# Patient Record
Sex: Female | Born: 1935 | Race: Black or African American | Hispanic: No | Marital: Married | Smoking: Never smoker
Health system: Southern US, Community
[De-identification: ages and names within clinical notes are randomized; demographics above are authoritative.]

## PROBLEM LIST (undated history)

## (undated) DIAGNOSIS — I10 Essential (primary) hypertension: Secondary | ICD-10-CM

## (undated) DIAGNOSIS — R011 Cardiac murmur, unspecified: Secondary | ICD-10-CM

## (undated) DIAGNOSIS — J45909 Unspecified asthma, uncomplicated: Secondary | ICD-10-CM

## (undated) DIAGNOSIS — G4730 Sleep apnea, unspecified: Secondary | ICD-10-CM

## (undated) DIAGNOSIS — Z8719 Personal history of other diseases of the digestive system: Secondary | ICD-10-CM

## (undated) DIAGNOSIS — M1990 Unspecified osteoarthritis, unspecified site: Secondary | ICD-10-CM

## (undated) DIAGNOSIS — E119 Type 2 diabetes mellitus without complications: Secondary | ICD-10-CM

## (undated) DIAGNOSIS — I739 Peripheral vascular disease, unspecified: Secondary | ICD-10-CM

## (undated) DIAGNOSIS — J189 Pneumonia, unspecified organism: Secondary | ICD-10-CM

## (undated) HISTORY — PX: ELBOW SURGERY: SHX618

---
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
## 1999-01-16 ENCOUNTER — Other Ambulatory Visit: Admission: RE | Admit: 1999-01-16 | Discharge: 1999-01-16 | Payer: Self-pay | Admitting: *Deleted

## 1999-11-04 ENCOUNTER — Emergency Department (HOSPITAL_COMMUNITY): Admission: EM | Admit: 1999-11-04 | Discharge: 1999-11-04 | Payer: Self-pay | Admitting: Emergency Medicine

## 1999-11-26 ENCOUNTER — Ambulatory Visit (HOSPITAL_COMMUNITY): Admission: RE | Admit: 1999-11-26 | Discharge: 1999-11-26 | Payer: Self-pay | Admitting: Cardiovascular Disease

## 1999-11-26 ENCOUNTER — Encounter: Payer: Self-pay | Admitting: Cardiovascular Disease

## 1999-12-27 ENCOUNTER — Other Ambulatory Visit: Admission: RE | Admit: 1999-12-27 | Discharge: 1999-12-27 | Payer: Self-pay | Admitting: *Deleted

## 2001-01-12 ENCOUNTER — Other Ambulatory Visit: Admission: RE | Admit: 2001-01-12 | Discharge: 2001-01-12 | Payer: Self-pay | Admitting: *Deleted

## 2001-09-22 ENCOUNTER — Emergency Department (HOSPITAL_COMMUNITY): Admission: EM | Admit: 2001-09-22 | Discharge: 2001-09-22 | Payer: Self-pay

## 2002-04-18 ENCOUNTER — Encounter: Admission: RE | Admit: 2002-04-18 | Discharge: 2002-07-17 | Payer: Self-pay | Admitting: Internal Medicine

## 2002-11-17 ENCOUNTER — Ambulatory Visit (HOSPITAL_BASED_OUTPATIENT_CLINIC_OR_DEPARTMENT_OTHER): Admission: RE | Admit: 2002-11-17 | Discharge: 2002-11-17 | Payer: Self-pay | Admitting: Internal Medicine

## 2003-01-04 ENCOUNTER — Ambulatory Visit (HOSPITAL_BASED_OUTPATIENT_CLINIC_OR_DEPARTMENT_OTHER): Admission: RE | Admit: 2003-01-04 | Discharge: 2003-01-04 | Payer: Self-pay | Admitting: Internal Medicine

## 2004-01-17 ENCOUNTER — Other Ambulatory Visit: Admission: RE | Admit: 2004-01-17 | Discharge: 2004-01-17 | Payer: Self-pay | Admitting: Obstetrics and Gynecology

## 2004-02-08 ENCOUNTER — Emergency Department (HOSPITAL_COMMUNITY): Admission: EM | Admit: 2004-02-08 | Discharge: 2004-02-08 | Payer: Self-pay | Admitting: Family Medicine

## 2006-02-02 ENCOUNTER — Ambulatory Visit: Payer: Self-pay | Admitting: Gastroenterology

## 2006-02-03 ENCOUNTER — Ambulatory Visit: Payer: Self-pay | Admitting: Gastroenterology

## 2006-03-12 ENCOUNTER — Ambulatory Visit: Payer: Self-pay | Admitting: Gastroenterology

## 2011-03-04 ENCOUNTER — Other Ambulatory Visit: Payer: Self-pay | Admitting: Gastroenterology

## 2011-03-25 ENCOUNTER — Inpatient Hospital Stay (INDEPENDENT_AMBULATORY_CARE_PROVIDER_SITE_OTHER)
Admission: RE | Admit: 2011-03-25 | Discharge: 2011-03-25 | Disposition: A | Discharge status: Home / Self Care | Payer: Medicare Other | Source: Ambulatory Visit | Attending: Family Medicine | Admitting: Family Medicine

## 2011-03-25 DIAGNOSIS — T6391XA Toxic effect of contact with unspecified venomous animal, accidental (unintentional), initial encounter: Secondary | ICD-10-CM

## 2011-04-23 ENCOUNTER — Encounter: Payer: Self-pay | Admitting: Internal Medicine

## 2012-06-23 ENCOUNTER — Encounter: Payer: Self-pay | Admitting: Internal Medicine

## 2016-07-24 ENCOUNTER — Emergency Department (HOSPITAL_COMMUNITY): Payer: Medicare Other

## 2016-07-24 ENCOUNTER — Emergency Department (HOSPITAL_COMMUNITY)
Admission: EM | Admit: 2016-07-24 | Discharge: 2016-07-24 | Disposition: A | Discharge status: Home / Self Care | Payer: Medicare Other | Attending: Emergency Medicine | Admitting: Emergency Medicine

## 2016-07-24 ENCOUNTER — Encounter (HOSPITAL_COMMUNITY): Payer: Self-pay

## 2016-07-24 DIAGNOSIS — Y9389 Activity, other specified: Secondary | ICD-10-CM | POA: Insufficient documentation

## 2016-07-24 DIAGNOSIS — R9089 Other abnormal findings on diagnostic imaging of central nervous system: Secondary | ICD-10-CM | POA: Diagnosis not present

## 2016-07-24 DIAGNOSIS — W108XXA Fall (on) (from) other stairs and steps, initial encounter: Secondary | ICD-10-CM | POA: Diagnosis not present

## 2016-07-24 DIAGNOSIS — M546 Pain in thoracic spine: Secondary | ICD-10-CM | POA: Insufficient documentation

## 2016-07-24 DIAGNOSIS — E119 Type 2 diabetes mellitus without complications: Secondary | ICD-10-CM | POA: Diagnosis not present

## 2016-07-24 DIAGNOSIS — W19XXXA Unspecified fall, initial encounter: Secondary | ICD-10-CM

## 2016-07-24 DIAGNOSIS — Y9289 Other specified places as the place of occurrence of the external cause: Secondary | ICD-10-CM | POA: Diagnosis not present

## 2016-07-24 DIAGNOSIS — Z5181 Encounter for therapeutic drug level monitoring: Secondary | ICD-10-CM | POA: Diagnosis not present

## 2016-07-24 DIAGNOSIS — Y999 Unspecified external cause status: Secondary | ICD-10-CM | POA: Insufficient documentation

## 2016-07-24 DIAGNOSIS — R51 Headache: Secondary | ICD-10-CM | POA: Insufficient documentation

## 2016-07-24 DIAGNOSIS — M549 Dorsalgia, unspecified: Secondary | ICD-10-CM

## 2016-07-24 HISTORY — DX: Type 2 diabetes mellitus without complications: E11.9

## 2016-07-24 LAB — PROTIME-INR
INR: 0.96
PROTHROMBIN TIME: 12.7 s (ref 11.4–15.2)

## 2016-07-24 LAB — CBC
HEMATOCRIT: 36 % (ref 36.0–46.0)
HEMOGLOBIN: 12.1 g/dL (ref 12.0–15.0)
MCH: 30.3 pg (ref 26.0–34.0)
MCHC: 33.6 g/dL (ref 30.0–36.0)
MCV: 90.2 fL (ref 78.0–100.0)
Platelets: 265 10*3/uL (ref 150–400)
RBC: 3.99 MIL/uL (ref 3.87–5.11)
RDW: 14 % (ref 11.5–15.5)
WBC: 6.1 10*3/uL (ref 4.0–10.5)

## 2016-07-24 LAB — I-STAT CHEM 8, ED
BUN: 33 mg/dL — ABNORMAL HIGH (ref 6–20)
CALCIUM ION: 1.23 mmol/L (ref 1.15–1.40)
CREATININE: 1.4 mg/dL — AB (ref 0.44–1.00)
Chloride: 109 mmol/L (ref 101–111)
Glucose, Bld: 116 mg/dL — ABNORMAL HIGH (ref 65–99)
HEMATOCRIT: 36 % (ref 36.0–46.0)
HEMOGLOBIN: 12.2 g/dL (ref 12.0–15.0)
Potassium: 5.2 mmol/L — ABNORMAL HIGH (ref 3.5–5.1)
SODIUM: 139 mmol/L (ref 135–145)
TCO2: 23 mmol/L (ref 0–100)

## 2016-07-24 LAB — URINALYSIS, ROUTINE W REFLEX MICROSCOPIC
Bilirubin Urine: NEGATIVE
GLUCOSE, UA: NEGATIVE mg/dL
Hgb urine dipstick: NEGATIVE
Ketones, ur: NEGATIVE mg/dL
LEUKOCYTES UA: NEGATIVE
NITRITE: NEGATIVE
PH: 5.5 (ref 5.0–8.0)
Protein, ur: NEGATIVE mg/dL
SPECIFIC GRAVITY, URINE: 1.008 (ref 1.005–1.030)

## 2016-07-24 LAB — COMPREHENSIVE METABOLIC PANEL
ALT: 21 U/L (ref 14–54)
ANION GAP: 7 (ref 5–15)
AST: 26 U/L (ref 15–41)
Albumin: 4.2 g/dL (ref 3.5–5.0)
Alkaline Phosphatase: 67 U/L (ref 38–126)
BUN: 35 mg/dL — AB (ref 6–20)
CHLORIDE: 109 mmol/L (ref 101–111)
CO2: 23 mmol/L (ref 22–32)
Calcium: 9.8 mg/dL (ref 8.9–10.3)
Creatinine, Ser: 1.4 mg/dL — ABNORMAL HIGH (ref 0.44–1.00)
GFR calc Af Amer: 39 mL/min — ABNORMAL LOW (ref >60)
GFR, EST NON AFRICAN AMERICAN: 34 mL/min — AB (ref >60)
Glucose, Bld: 122 mg/dL — ABNORMAL HIGH (ref 65–99)
POTASSIUM: 5.1 mmol/L (ref 3.5–5.1)
Sodium: 139 mmol/L (ref 135–145)
Total Bilirubin: 0.4 mg/dL (ref 0.3–1.2)
Total Protein: 7.1 g/dL (ref 6.5–8.1)

## 2016-07-24 LAB — SAMPLE TO BLOOD BANK

## 2016-07-24 LAB — CBG MONITORING, ED: Glucose-Capillary: 110 mg/dL — ABNORMAL HIGH (ref 65–99)

## 2016-07-24 LAB — I-STAT CG4 LACTIC ACID, ED: Lactic Acid, Venous: 0.56 mmol/L (ref 0.5–1.9)

## 2016-07-24 MED ORDER — TRAMADOL HCL 50 MG PO TABS: 50.0000 mg | ORAL_TABLET | Freq: Four times a day (QID) | ORAL | 0 refills | Status: DC | PRN

## 2016-07-24 MED ORDER — SODIUM CHLORIDE 0.9 % IV BOLUS (SEPSIS)
500.0000 mL | Freq: Once | INTRAVENOUS | Status: AC
  Administered 2016-07-24: 500 mL via INTRAVENOUS

## 2016-07-24 MED ORDER — FENTANYL CITRATE (PF) 100 MCG/2ML IJ SOLN
50.0000 ug | Freq: Once | INTRAMUSCULAR | Status: AC
  Administered 2016-07-24: 50 ug via INTRAVENOUS
  Filled 2016-07-24: qty 2

## 2016-07-24 MED ORDER — ONDANSETRON HCL 4 MG/2ML IJ SOLN
4.0000 mg | Freq: Once | INTRAMUSCULAR | Status: AC
  Administered 2016-07-24: 4 mg via INTRAVENOUS
  Filled 2016-07-24: qty 2

## 2016-07-24 NOTE — ED Triage Notes (Signed)
[place] escal[place]. The hand railing stopped but the stairs kept moving. The patient reports falling on her buttocks and then falling multiple times as she preceded down the escalator. Endorses hitting her head but denies losing consciousness. Endorses nausea. EMS said vitals during transport were 170/80 with HR 80. Pt has hx of diabetes.

## 2016-07-24 NOTE — ED Provider Notes (Signed)
[facility] DEPT Provider Note   CSN: [id number] Arrival date & time: 07/24/16  1139     History   Chief Complaint Chief Complaint  Patient presents with  . Fall    HPI Janet Gilbert is a 82 y.o. female with a past medical history significant for diabetes who presents with back pain and headache from a fall. Patient reports that she was at the mall going down escalator today when she fell. She reports that the handrail stopped moving and the stairs continued to descend causing her to fall onto her back. She reports that she then tumbled down the escalator causing injury to her back. She says that she did not lose consciousness but is having moderate to severe pain in the central upper back and her head. She reports pain in the occipital area. Of note, she reports that she was wearing a very "large puffy jacket" that may have helped break the fall. She denies any chest pain, shortness of breath, abdominal pain. She denies any numbness, tingling, or weakness of extremity. She denies any vision changes, nausea, vomiting, or any other complaints.  He has not taken any medications to help her symptoms. EMS was called from the mall and brought her for evaluation. She has not lost control of her bowel or bladder.  She denied any preceding symptoms before her fall.  The history is provided by the patient, the spouse and a relative. No language interpreter was used.  Fall  This is a new problem. The current episode started less than 1 hour ago. The problem has not changed since onset.Associated symptoms include headaches. Pertinent negatives include no chest pain, no abdominal pain and no shortness of breath. The symptoms are aggravated by bending. Nothing relieves the symptoms. She has tried nothing for the symptoms. The treatment provided no relief.    Past Medical History:  Diagnosis Date  . Diabetes mellitus without complication ([place])     There are no active problems to display for this  patient.   History reviewed. No pertinent surgical history.  OB History    No data available       Home Medications    Prior to Admission medications   Not on File    Family History No family history on file.  Social History Social History  Substance Use Topics  . Smoking status: Not on file  . Smokeless tobacco: Not on file  . Alcohol use Not on file     Allergies   Contrast media [iodinated diagnostic agents] and Sulfa antibiotics   Review of Systems Review of Systems  Constitutional: Negative for activity change, chills, diaphoresis, fatigue and fever.  HENT: Negative for congestion and rhinorrhea.   Eyes: Negative for visual disturbance.  Respiratory: Negative for cough, chest tightness, shortness of breath and stridor.   Cardiovascular: Negative for chest pain, palpitations and leg swelling.  Gastrointestinal: Negative for abdominal distention, abdominal pain, constipation, diarrhea, nausea and vomiting.  Genitourinary: Negative for difficulty urinating, dysuria, flank pain, frequency, hematuria, menstrual problem, pelvic pain, vaginal bleeding and vaginal discharge.  Musculoskeletal: Positive for back pain. Negative for neck pain and neck stiffness.  Skin: Negative for rash and wound.  Neurological: Positive for headaches. Negative for dizziness, weakness, light-headedness and numbness.  Psychiatric/Behavioral: Negative for agitation and confusion.  All other systems reviewed and are negative.    Physical Exam Updated Vital Signs BP 143/77 (BP Location: Right Arm)   Pulse 82   Temp 97.9 F (36.6 C) (Oral)  Resp 18   SpO2 100%   Physical Exam  Constitutional: She is oriented to person, place, and time. She appears well-developed and well-nourished. No distress.  HENT:  Head: Normocephalic and atraumatic. Head is without abrasion, without contusion and without laceration.    Mouth/Throat: Oropharynx is clear and moist. No oropharyngeal exudate.   Eyes: Conjunctivae and EOM are normal. Pupils are equal, round, and reactive to light.  Neck: Normal range of motion. Neck supple.  Cardiovascular: Normal rate and regular rhythm.   No murmur heard. Pulmonary/Chest: Effort normal and breath sounds normal. No stridor. No respiratory distress. She has no wheezes. She exhibits no tenderness.  Abdominal: Soft. There is no tenderness.  Musculoskeletal: She exhibits tenderness. She exhibits no edema.       Thoracic back: She exhibits tenderness.       Back:  Neurological: She is alert and oriented to person, place, and time. She has normal reflexes. She displays no tremor and normal reflexes. No cranial nerve deficit or sensory deficit. She exhibits normal muscle tone. She displays no seizure activity. Coordination normal.  Skin: Skin is warm and dry. Capillary refill takes less than 2 seconds. No rash noted.  Psychiatric: She has a normal mood and affect.  Nursing note and vitals reviewed.    ED Treatments / Results  Labs (all labs ordered are listed, but only abnormal results are displayed) Labs Reviewed  COMPREHENSIVE METABOLIC PANEL - Abnormal; Notable for the following:       Result Value   Glucose, Bld 122 (*)    BUN 35 (*)    Creatinine, Ser 1.40 (*)    GFR calc non Af Amer 34 (*)    GFR calc Af Amer 39 (*)    All other components within normal limits  CBG MONITORING, ED - Abnormal; Notable for the following:    Glucose-Capillary 110 (*)    All other components within normal limits  I-STAT CHEM 8, ED - Abnormal; Notable for the following:    Potassium 5.2 (*)    BUN 33 (*)    Creatinine, Ser 1.40 (*)    Glucose, Bld 116 (*)    All other components within normal limits  CBC  URINALYSIS, ROUTINE W REFLEX MICROSCOPIC (NOT AT [facility])  PROTIME-INR  I-STAT CG4 LACTIC ACID, ED  SAMPLE TO BLOOD BANK    EKG  EKG Interpretation None       Radiology Ct Abdomen Pelvis Wo Contrast  Result Date: 07/24/2016 CLINICAL DATA:   [place] [address] down escalator today. EXAM: CT CHEST, ABDOMEN AND PELVIS WITHOUT CONTRAST TECHNIQUE: Multidetector CT imaging of the chest, abdomen and pelvis was performed following the standard protocol without IV contrast. COMPARISON:  None. FINDINGS: CT CHEST FINDINGS Cardiovascular: The heart is normal in size. No pericardial effusion. The aorta is normal in caliber. Minimal scattered atherosclerotic calcifications. Coronary artery calcifications are noted. Mediastinum/Nodes: There is a rounded soft tissue mass in the anterior mediastinum measuring 20 x 19 mm. Is most likely a benign thymic lesion but will require followup. A repeat noncontrast chest CT in 4-6 months is suggested. Small scattered mediastinal and hilar lymph nodes but no overt adenopathy. The esophagus is grossly normal. Lungs/Pleura: No acute pulmonary findings. No pulmonary contusion, infiltrate or pleural effusion. No pneumothorax. A few tiny scattered sub 4 mm nodules are likely benign. Musculoskeletal: The bony thorax is intact. No obvious rib, sternal or thoracic vertebral body fractures. Scattered hemangiomas are noted. CT ABDOMEN PELVIS FINDINGS Hepatobiliary: No focal hepatic lesions are  identified without contrast. No findings to suggest an acute injury. No perihepatic fluid collections. The gallbladder demonstrates numerous layering gallstones. The common bile duct is normal in caliber. Pancreas: No mass, inflammation or ductal dilatation. Spleen: Normal size.  No focal lesions. Adrenals/Urinary Tract: Mild enlargement and nodularity of the adrenal gland suggesting nodular hyperplasia. 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 A 3 cm cyst projects off the midpole region of the left kidney. A small cyst projects off the midpole region of the right kidney also. No renal or obstructing ureteral calculi. No bladder calculi. Stomach/Bowel: The stomach, duodenum, small bowel and colon are unremarkable. No  inflammatory changes, mass lesions or obstructive findings. Moderate sigmoid diverticulosis without findings for acute diverticulitis. The terminal ileum is normal. Vascular/Lymphatic: Fell mesenteric or retroperitoneal mass, adenopathy or hematoma. Small scattered lymph nodes are noted. The aorta is normal in caliber. Moderate scattered atherosclerotic calcifications. Reproductive: The uterus is surgically absent. The right ovary is still present. Other: No old abnormal wall abdominal/pelvic fluid collections or hematoma. Musculoskeletal: No significant bony findings. The bony pelvis is intact. IMPRESSION: 1. No acute findings involving the chest, abdomen or pelvis. 2. **An incidental finding of potential clinical significance has been found. There is a rounded soft tissue mass in the anterior mediastinum measuring 20 x 19 mm. Is most likely a benign thymic lesion but will require followup. A repeat noncontrast chest CT in 4-6 months is suggested.** Electronically Signed   By: Marijo Sanes M.D.   On: 07/24/2016 14:07   Ct Head Wo Contrast  Result Date: 07/24/2016 CLINICAL DATA:  [place] [address] down escalator , hit back of the head, neck pain EXAM: CT HEAD WITHOUT CONTRAST CT CERVICAL SPINE WITHOUT CONTRAST TECHNIQUE: Multidetector CT imaging of the head and cervical spine was performed following the standard protocol without intravenous contrast. Multiplanar CT image reconstructions of the cervical spine were also generated. COMPARISON:  None. FINDINGS: CT HEAD FINDINGS Brain: No intracranial hemorrhage, mass effect or midline shift. No acute cortical infarction. Mild cerebral atrophy. Mild periventricular chronic white matter disease. No mass lesion is noted on this unenhanced scan. Vascular: Atherosclerotic calcifications of carotid siphon. Skull: No skull fracture is noted. Sinuses/Orbits: Paranasal sinuses and mastoid air cells are unremarkable Other: None CT CERVICAL SPINE FINDINGS Alignment: Alignment is  preserved. Skull base and vertebrae: No acute fracture or subluxation. Degenerative changes are noted C1-C2 articulation. There is anterior spurring lower endplate of C5 and C6 vertebral body. Soft tissues and spinal canal: No prevertebral soft tissue swelling. Cervical airway is patent. Disc levels:  Disc spaces are preserved Upper chest: The visualized lung apices shows no evidence of pneumothorax. Other: None IMPRESSION: 1. No acute intracranial abnormality.  Mild cerebral atrophy. 2. No cervical spine acute fracture or subluxation. Mild degenerative changes. Electronically Signed   By: Lahoma Crocker M.D.   On: 07/24/2016 13:55   Ct Chest Wo Contrast  Result Date: 07/24/2016 CLINICAL DATA:  [place] [address] down escalator today. EXAM: CT CHEST, ABDOMEN AND PELVIS WITHOUT CONTRAST TECHNIQUE: Multidetector CT imaging of the chest, abdomen and pelvis was performed following the standard protocol without IV contrast. COMPARISON:  None. FINDINGS: CT CHEST FINDINGS Cardiovascular: The heart is normal in size. No pericardial effusion. The aorta is normal in caliber. Minimal scattered atherosclerotic calcifications. Coronary artery calcifications are noted. Mediastinum/Nodes: There is a rounded soft tissue mass in the anterior mediastinum measuring 20 x 19 mm. Is most likely a benign thymic lesion but will require followup.  A repeat noncontrast chest CT in 4-6 months is suggested. Small scattered mediastinal and hilar lymph nodes but no overt adenopathy. The esophagus is grossly normal. Lungs/Pleura: No acute pulmonary findings. No pulmonary contusion, infiltrate or pleural effusion. No pneumothorax. A few tiny scattered sub 4 mm nodules are likely benign. Musculoskeletal: The bony thorax is intact. No obvious rib, sternal or thoracic vertebral body fractures. Scattered hemangiomas are noted. CT ABDOMEN PELVIS FINDINGS Hepatobiliary: No focal hepatic lesions are identified without contrast. No findings to suggest an acute injury.  No perihepatic fluid collections. The gallbladder demonstrates numerous layering gallstones. The common bile duct is normal in caliber. Pancreas: No mass, inflammation or ductal dilatation. Spleen: Normal size.  No focal lesions. Adrenals/Urinary Tract: Mild enlargement and nodularity of the adrenal gland suggesting nodular hyperplasia. 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 A 3 cm cyst projects off the midpole region of the left kidney. A small cyst projects off the midpole region of the right kidney also. No renal or obstructing ureteral calculi. No bladder calculi. Stomach/Bowel: The stomach, duodenum, small bowel and colon are unremarkable. No inflammatory changes, mass lesions or obstructive findings. Moderate sigmoid diverticulosis without findings for acute diverticulitis. The terminal ileum is normal. Vascular/Lymphatic: Fell mesenteric or retroperitoneal mass, adenopathy or hematoma. Small scattered lymph nodes are noted. The aorta is normal in caliber. Moderate scattered atherosclerotic calcifications. Reproductive: The uterus is surgically absent. The right ovary is still present. Other: No old abnormal wall abdominal/pelvic fluid collections or hematoma. Musculoskeletal: No significant bony findings. The bony pelvis is intact. IMPRESSION: 1. No acute findings involving the chest, abdomen or pelvis. 2. **An incidental finding of potential clinical significance has been found. There is a rounded soft tissue mass in the anterior mediastinum measuring 20 x 19 mm. Is most likely a benign thymic lesion but will require followup. A repeat noncontrast chest CT in 4-6 months is suggested.** Electronically Signed   By: Marijo Sanes M.D.   On: 07/24/2016 14:07   Ct Cervical Spine Wo Contrast  Result Date: 07/24/2016 CLINICAL DATA:  [place] [address] down escalator , hit back of the head, neck pain EXAM: CT HEAD WITHOUT CONTRAST CT CERVICAL SPINE WITHOUT CONTRAST TECHNIQUE:  Multidetector CT imaging of the head and cervical spine was performed following the standard protocol without intravenous contrast. Multiplanar CT image reconstructions of the cervical spine were also generated. COMPARISON:  None. FINDINGS: CT HEAD FINDINGS Brain: No intracranial hemorrhage, mass effect or midline shift. No acute cortical infarction. Mild cerebral atrophy. Mild periventricular chronic white matter disease. No mass lesion is noted on this unenhanced scan. Vascular: Atherosclerotic calcifications of carotid siphon. Skull: No skull fracture is noted. Sinuses/Orbits: Paranasal sinuses and mastoid air cells are unremarkable Other: None CT CERVICAL SPINE FINDINGS Alignment: Alignment is preserved. Skull base and vertebrae: No acute fracture or subluxation. Degenerative changes are noted C1-C2 articulation. There is anterior spurring lower endplate of C5 and C6 vertebral body. Soft tissues and spinal canal: No prevertebral soft tissue swelling. Cervical airway is patent. Disc levels:  Disc spaces are preserved Upper chest: The visualized lung apices shows no evidence of pneumothorax. Other: None IMPRESSION: 1. No acute intracranial abnormality.  Mild cerebral atrophy. 2. No cervical spine acute fracture or subluxation. Mild degenerative changes. Electronically Signed   By: Lahoma Crocker M.D.   On: 07/24/2016 13:55    Procedures Procedures (including critical care time)  Medications Ordered in ED Medications  ondansetron (ZOFRAN) injection 4 mg (4 mg  Intravenous Given 07/24/16 1353)  fentaNYL (SUBLIMAZE) injection 50 mcg (50 mcg Intravenous Given 07/24/16 1354)  sodium chloride 0.9 % bolus 500 mL (0 mLs Intravenous Stopped 07/24/16 1545)     Initial Impression / Assessment and Plan / ED Course  I have reviewed the triage vital signs and the nursing notes.  Pertinent labs & imaging results that were available during my care of the patient were reviewed by me and considered in my medical decision  making (see chart for details).  Clinical Course  Value Comment By Time  Hemoglobin: 12.2 (Reviewed) Gwenyth Allegra Elaina Cara, MD 11/02 1800  Total Bilirubin: 0.4 (Reviewed) Gwenyth Allegra Billey Wojciak, MD 11/02 [place] is a 82 y.o. female with a past medical history significant for diabetes who presents with back pain and headache from a fall.  History and exam are seen above.  On exam, patient had no evidence of laceration. Patient had tenderness in her occiput and upper cervical neck. Tenderness in the midthoracic back. Exam otherwise unremarkable.  Given the patient's age and her significant tumbling fall, patient will have trauma workup to look for injuries. Patient given pain medication to help her symptoms.  Patient's diagnostic workup returned showing no evidence of acute injury at this time. No bony injury in the CT of the head, neck, or chest abdomen and pelvis.   Laboratory testing showed no evidence of anemia, mild hyperkalemia at 5.2 which appears similar to prior for patient. Kidney function slightly elevated at 1.4 however, there is no baseline for the patient. No CVA tenderness and no hematuria which might suggest kidney injury.  Given patient's significant improvement in pain following medications and her negative workup, feel patient is appropriate for discharge. Patient given strict return precautions for any new or worsening symptoms. Patient given instructions to follow-up with PCP for further management. Patient given prescription for pain medication and is going home with family to watch her.  Patient family understood plan of care and patient was discharged in good condition.    Final Clinical Impressions(s) / ED Diagnoses   Final diagnoses:  Fall, initial encounter  Acute back pain, unspecified back location, unspecified back pain laterality    New Prescriptions Discharge Medication List as of 07/24/2016  3:34 PM    START taking these medications    Details  traMADol (ULTRAM) 50 MG tablet Take 1 tablet (50 mg total) by mouth every 6 (six) hours as needed., Starting Thu 07/24/2016, Print        Clinical Impression: 1. Fall, initial encounter   2. Fall   3. Acute back pain, unspecified back location, unspecified back pain laterality     Disposition: Discharge  Condition: Good  I have discussed the results, Dx and Tx plan with the pt(& family if present). He/she/they expressed understanding and agree(s) with the plan. Discharge instructions discussed at great length. Strict return precautions discussed and pt &/or family have verbalized understanding of the instructions. No further questions at time of discharge.    Discharge Medication List as of 07/24/2016  3:34 PM    START taking these medications   Details  traMADol (ULTRAM) 50 MG tablet Take 1 tablet (50 mg total) by mouth every 6 (six) hours as needed., Starting Thu 07/24/2016, Print        Follow Up: [place] [phone] [place], MD 07/24/16 [phone]

## 2016-07-24 NOTE — Discharge Instructions (Signed)
Please follow-up with your PCP for further management of her fall. If any new symptoms return, please return to the nearest ED.

## 2016-12-16 ENCOUNTER — Other Ambulatory Visit: Payer: Self-pay | Admitting: Orthopedic Surgery

## 2016-12-26 NOTE — Pre-Procedure Instructions (Addendum)
Deyana Wnuk [facility]  12/26/2016      [facility] [address], [place] [address] [place] [address] Phone: [phone] Fax: [phone]    Your procedure is scheduled on April 12  Report to [place] at 800 A.M.  Call this number if you have problems the morning of surgery:  [phone]   Remember:  Do not eat food or drink liquids after midnight.  Take these medicines the morning of surgery with A SIP OF WATER tylenol if needed, amlodipine (norvasc), loratadine (Claritin), Tramadol (ultram) if needed   Stop taking BC's, Goody's, Herbal medications, Fish oil, Ibuprofen, Advil, Motrin, Aleve,ALL Vitamins/SUPPLEMENTS,,aspirin,ASPERCREME,ARTHRITIS CREAM  No metformin day of surgery   How to Manage Your Diabetes Before and After Surgery  Why is it important to control my blood sugar before and after surgery? . Improving blood sugar levels before and after surgery helps healing and can limit problems. . A way of improving blood sugar control is eating a healthy diet by: o  Eating less sugar and carbohydrates o  Increasing activity/exercise o  Talking with your doctor about reaching your blood sugar goals . High blood sugars (greater than 180 mg/dL) can raise your risk of infections and slow your recovery, so you will need to focus on controlling your diabetes during the weeks before surgery. . Make sure that the doctor who takes care of your diabetes knows about your planned surgery including the date and location.  How do I manage my blood sugar before surgery? . Check your blood sugar at least 4 times a day, starting 2 days before surgery, to make sure that the level is not too high or low. o Check your blood sugar the morning of your surgery when you wake up and every 2 hours until you get to the Short Stay unit. . If your blood sugar is less than 70 mg/dL, you will need to treat  for low blood sugar: o Do not take insulin. o Treat a low blood sugar (less than 70 mg/dL) with  cup of clear juice (cranberry or apple), 4 glucose tablets, OR glucose gel. o Recheck blood sugar in 15 minutes after treatment (to make sure it is greater than 70 mg/dL). If your blood sugar is not greater than 70 mg/dL on recheck, call [phone] for further instructions. . Report your blood sugar to the short stay nurse when you get to Short Stay.  . If you are admitted to the hospital after surgery: o Your blood sugar will be checked by the staff and you will probably be given insulin after surgery (instead of oral diabetes medicines) to make sure you have good blood sugar levels. o The goal for blood sugar control after surgery is 80-180 mg/dL.    WHAT DO I DO ABOUT MY DIABETES MEDICATION?   Marland Kitchen Do not take oral diabetes medicines (pills) the morning of surgery. Metformin (Glucophage)    Do not wear jewelry, make-up or nail polish.  Do not wear lotions, powders, or perfumes, or deoderant.  Do not shave 48 hours prior to surgery.  Men may shave face and neck.  Do not bring valuables to the hospital.  [facility] is not responsible for any belongings or valuables.  Contacts, dentures or bridgework may not be worn into surgery.  Leave your suitcase in the car.  After surgery it may be brought to your  room.  For patients admitted to the hospital, discharge time will be determined by your treatment team.  Patients discharged the day of surgery will not be allowed to drive home.   Special instructions:  [place] - Preparing for Surgery  Before surgery, you can play an important role.  Because skin is not sterile, your skin needs to be as free of germs as possible.  You can reduce the number of germs on you skin by washing with CHG (chlorahexidine gluconate) soap before surgery.  CHG is an antiseptic cleaner which kills germs and bonds with the skin to continue killing germs even after  washing.  Please DO NOT use if you have an allergy to CHG or antibacterial soaps.  If your skin becomes reddened/irritated stop using the CHG and inform your nurse when you arrive at Short Stay.  Do not shave (including legs and underarms) for at least 48 hours prior to the first CHG shower.  You may shave your face.  Please follow these instructions carefully:   1.  Shower with CHG Soap the night before surgery and the                                morning of Surgery.  2.  If you choose to wash your hair, wash your hair first as usual with your       normal shampoo.  3.  After you shampoo, rinse your hair and body thoroughly to remove the                      Shampoo.  4.  Use CHG as you would any other liquid soap.  You can apply chg directly       to the skin and wash gently with scrungie or a clean washcloth.  5.  Apply the CHG Soap to your body ONLY FROM THE NECK DOWN.        Do not use on open wounds or open sores.  Avoid contact with your eyes,       ears, mouth and genitals (private parts).  Wash genitals (private parts)       with your normal soap.  6.  Wash thoroughly, paying special attention to the area where your surgery        will be performed.  7.  Thoroughly rinse your body with warm water from the neck down.  8.  DO NOT shower/wash with your normal soap after using and rinsing off       the CHG Soap.  9.  Pat yourself dry with a clean towel.            10.  Wear clean pajamas.            11.  Place clean sheets on your bed the night of your first shower and do not        sleep with pets.  Day of Surgery  Do not apply any lotions/deoderants the morning of surgery.  Please wear clean clothes to the hospital/surgery center.     Please read over the fact sheets that you were given.

## 2016-12-29 ENCOUNTER — Encounter (HOSPITAL_COMMUNITY)
Admission: RE | Admit: 2016-12-29 | Discharge: 2016-12-29 | Disposition: A | Discharge status: Home / Self Care | Payer: Medicare Other | Source: Ambulatory Visit | Attending: Orthopedic Surgery | Admitting: Orthopedic Surgery

## 2016-12-29 ENCOUNTER — Encounter (HOSPITAL_COMMUNITY): Payer: Self-pay | Admitting: *Deleted

## 2016-12-29 ENCOUNTER — Ambulatory Visit (HOSPITAL_COMMUNITY)
Admission: RE | Admit: 2016-12-29 | Discharge: 2016-12-29 | Disposition: A | Discharge status: Home / Self Care | Payer: Medicare Other | Source: Ambulatory Visit | Attending: Orthopedic Surgery | Admitting: Orthopedic Surgery

## 2016-12-29 DIAGNOSIS — Z01818 Encounter for other preprocedural examination: Secondary | ICD-10-CM

## 2016-12-29 DIAGNOSIS — Z79899 Other long term (current) drug therapy: Secondary | ICD-10-CM | POA: Insufficient documentation

## 2016-12-29 DIAGNOSIS — I452 Bifascicular block: Secondary | ICD-10-CM

## 2016-12-29 DIAGNOSIS — Z01812 Encounter for preprocedural laboratory examination: Secondary | ICD-10-CM | POA: Insufficient documentation

## 2016-12-29 DIAGNOSIS — E119 Type 2 diabetes mellitus without complications: Secondary | ICD-10-CM | POA: Insufficient documentation

## 2016-12-29 DIAGNOSIS — Z7984 Long term (current) use of oral hypoglycemic drugs: Secondary | ICD-10-CM | POA: Insufficient documentation

## 2016-12-29 DIAGNOSIS — I10 Essential (primary) hypertension: Secondary | ICD-10-CM

## 2016-12-29 DIAGNOSIS — Z7982 Long term (current) use of aspirin: Secondary | ICD-10-CM

## 2016-12-29 DIAGNOSIS — G4733 Obstructive sleep apnea (adult) (pediatric): Secondary | ICD-10-CM | POA: Insufficient documentation

## 2016-12-29 DIAGNOSIS — Z87891 Personal history of nicotine dependence: Secondary | ICD-10-CM | POA: Insufficient documentation

## 2016-12-29 DIAGNOSIS — M19011 Primary osteoarthritis, right shoulder: Secondary | ICD-10-CM | POA: Insufficient documentation

## 2016-12-29 DIAGNOSIS — R9431 Abnormal electrocardiogram [ECG] [EKG]: Secondary | ICD-10-CM | POA: Insufficient documentation

## 2016-12-29 DIAGNOSIS — M75101 Unspecified rotator cuff tear or rupture of right shoulder, not specified as traumatic: Secondary | ICD-10-CM

## 2016-12-29 HISTORY — DX: Sleep apnea, unspecified: G47.30

## 2016-12-29 HISTORY — DX: Unspecified osteoarthritis, unspecified site: M19.90

## 2016-12-29 HISTORY — DX: Essential (primary) hypertension: I10

## 2016-12-29 HISTORY — DX: Pneumonia, unspecified organism: J18.9

## 2016-12-29 HISTORY — DX: Cardiac murmur, unspecified: R01.1

## 2016-12-29 HISTORY — DX: Unspecified asthma, uncomplicated: J45.909

## 2016-12-29 HISTORY — DX: Personal history of other diseases of the digestive system: Z87.19

## 2016-12-29 HISTORY — DX: Peripheral vascular disease, unspecified: I73.9

## 2016-12-29 LAB — COMPREHENSIVE METABOLIC PANEL
ALBUMIN: 4 g/dL (ref 3.5–5.0)
ALK PHOS: 63 U/L (ref 38–126)
ALT: 20 U/L (ref 14–54)
ANION GAP: 6 (ref 5–15)
AST: 28 U/L (ref 15–41)
BILIRUBIN TOTAL: 0.3 mg/dL (ref 0.3–1.2)
BUN: 16 mg/dL (ref 6–20)
CALCIUM: 9.5 mg/dL (ref 8.9–10.3)
CO2: 23 mmol/L (ref 22–32)
CREATININE: 1.32 mg/dL — AB (ref 0.44–1.00)
Chloride: 110 mmol/L (ref 101–111)
GFR calc non Af Amer: 36 mL/min — ABNORMAL LOW (ref >60)
GFR, EST AFRICAN AMERICAN: 42 mL/min — AB (ref >60)
GLUCOSE: 133 mg/dL — AB (ref 65–99)
Potassium: 4.6 mmol/L (ref 3.5–5.1)
Sodium: 139 mmol/L (ref 135–145)
TOTAL PROTEIN: 7 g/dL (ref 6.5–8.1)

## 2016-12-29 LAB — CBC WITH DIFFERENTIAL/PLATELET
Basophils Absolute: 0 10*3/uL (ref 0.0–0.1)
Basophils Relative: 0 %
Eosinophils Absolute: 0.3 10*3/uL (ref 0.0–0.7)
Eosinophils Relative: 6 %
HEMATOCRIT: 36.5 % (ref 36.0–46.0)
Hemoglobin: 11.7 g/dL — ABNORMAL LOW (ref 12.0–15.0)
LYMPHS ABS: 2.2 10*3/uL (ref 0.7–4.0)
LYMPHS PCT: 39 %
MCH: 29.3 pg (ref 26.0–34.0)
MCHC: 32.1 g/dL (ref 30.0–36.0)
MCV: 91.3 fL (ref 78.0–100.0)
MONOS PCT: 6 %
Monocytes Absolute: 0.3 10*3/uL (ref 0.1–1.0)
NEUTROS ABS: 2.8 10*3/uL (ref 1.7–7.7)
NEUTROS PCT: 49 %
Platelets: 280 10*3/uL (ref 150–400)
RBC: 4 MIL/uL (ref 3.87–5.11)
RDW: 13.9 % (ref 11.5–15.5)
WBC: 5.7 10*3/uL (ref 4.0–10.5)

## 2016-12-29 LAB — URINALYSIS, ROUTINE W REFLEX MICROSCOPIC
BILIRUBIN URINE: NEGATIVE
GLUCOSE, UA: NEGATIVE mg/dL
HGB URINE DIPSTICK: NEGATIVE
Ketones, ur: NEGATIVE mg/dL
Leukocytes, UA: NEGATIVE
Nitrite: NEGATIVE
Protein, ur: NEGATIVE mg/dL
SPECIFIC GRAVITY, URINE: 1.01 (ref 1.005–1.030)
pH: 5 (ref 5.0–8.0)

## 2016-12-29 LAB — SURGICAL PCR SCREEN
MRSA, PCR: NEGATIVE
Staphylococcus aureus: NEGATIVE

## 2016-12-29 LAB — GLUCOSE, CAPILLARY: GLUCOSE-CAPILLARY: 127 mg/dL — AB (ref 65–99)

## 2016-12-29 LAB — PROTIME-INR
INR: 0.93
Prothrombin Time: 12.5 seconds (ref 11.4–15.2)

## 2016-12-29 LAB — APTT: aPTT: 25 seconds (ref 24–36)

## 2016-12-30 LAB — HEMOGLOBIN A1C
HEMOGLOBIN A1C: 6.7 % — AB (ref 4.8–5.6)
MEAN PLASMA GLUCOSE: 146 mg/dL

## 2016-12-30 NOTE — Progress Notes (Signed)
Anesthesia Chart Review:  Pt is an 83 year old female scheduled for R reverse shoulder arthroplasty on 01/01/2017 with Tania Ade, MD.   - PCP is Lavone Orn, MD  PMH includes: HTN, DM, heart murmur, OSA (no CPAP), asthma (as a child). Former smoker. BMI 24.5.  Medications include: Amlodipine, ASA 81 mg, Lipitor, losartan, metformin  Preoperative labs reviewed.  HbA1c 6.7, glucose 133  CXR 12/29/16: No acute abnormalities.  EKG 12/29/16: NSR. RBBB. LAFB. Minimal voltage criteria for LVH, may be normal variant. Appears stable when compared to EKG 11/06/08 from PCP's office.  Nuclear stress test 11/13/08 [facility] cardiology): 1. There is evidence of mild defect in the apical inferior regions. There is no scintigraphic evidence of inducible myocardial ischemia. The observed defect is consistent with breast attenuation artifact.  2. No regional wall motion abnormalities. Post-stress LV function normal. Post-stress EF 80%. 3. Exercise capacity 7.0 METS. Normal max effort GXT. 4. Abnormal myocardial perfusion study. This was essentially a low risk scan  If no changes, I anticipate pt can proceed with surgery as scheduled.   Willeen Cass, FNP-BC [facility] Stay [facility]/Anesthesiology Phone: [phone] 12/30/2016 4:41 PM

## 2016-12-31 MED ORDER — CEFAZOLIN SODIUM-DEXTROSE 2-4 GM/100ML-% IV SOLN
2.0000 g | INTRAVENOUS | Status: AC
  Administered 2017-01-01: 2 g via INTRAVENOUS
  Filled 2016-12-31: qty 100

## 2017-01-01 ENCOUNTER — Inpatient Hospital Stay (HOSPITAL_COMMUNITY)
Admission: RE | Admit: 2017-01-01 | Discharge: 2017-01-02 | DRG: 483 | Disposition: A | Discharge status: Home / Self Care | Payer: Medicare Other | Source: Ambulatory Visit | Attending: Orthopedic Surgery | Admitting: Orthopedic Surgery

## 2017-01-01 ENCOUNTER — Inpatient Hospital Stay (HOSPITAL_COMMUNITY): Payer: Medicare Other

## 2017-01-01 ENCOUNTER — Inpatient Hospital Stay (HOSPITAL_COMMUNITY): Payer: Medicare Other | Admitting: Emergency Medicine

## 2017-01-01 ENCOUNTER — Encounter (HOSPITAL_COMMUNITY)
Admission: RE | Disposition: A | Discharge status: Home / Self Care | Payer: Self-pay | Source: Ambulatory Visit | Attending: Orthopedic Surgery

## 2017-01-01 ENCOUNTER — Encounter (HOSPITAL_COMMUNITY): Payer: Self-pay | Admitting: *Deleted

## 2017-01-01 ENCOUNTER — Inpatient Hospital Stay (HOSPITAL_COMMUNITY): Payer: Medicare Other | Admitting: Anesthesiology

## 2017-01-01 DIAGNOSIS — Z01818 Encounter for other preprocedural examination: Secondary | ICD-10-CM

## 2017-01-01 DIAGNOSIS — Z91041 Radiographic dye allergy status: Secondary | ICD-10-CM | POA: Diagnosis not present

## 2017-01-01 DIAGNOSIS — Z86718 Personal history of other venous thrombosis and embolism: Secondary | ICD-10-CM

## 2017-01-01 DIAGNOSIS — Z87891 Personal history of nicotine dependence: Secondary | ICD-10-CM

## 2017-01-01 DIAGNOSIS — Z888 Allergy status to other drugs, medicaments and biological substances status: Secondary | ICD-10-CM

## 2017-01-01 DIAGNOSIS — E1151 Type 2 diabetes mellitus with diabetic peripheral angiopathy without gangrene: Secondary | ICD-10-CM | POA: Diagnosis present

## 2017-01-01 DIAGNOSIS — Z90710 Acquired absence of both cervix and uterus: Secondary | ICD-10-CM

## 2017-01-01 DIAGNOSIS — M75101 Unspecified rotator cuff tear or rupture of right shoulder, not specified as traumatic: Principal | ICD-10-CM | POA: Diagnosis present

## 2017-01-01 DIAGNOSIS — G8929 Other chronic pain: Secondary | ICD-10-CM | POA: Diagnosis present

## 2017-01-01 DIAGNOSIS — E875 Hyperkalemia: Secondary | ICD-10-CM | POA: Diagnosis present

## 2017-01-01 DIAGNOSIS — Z79899 Other long term (current) drug therapy: Secondary | ICD-10-CM | POA: Diagnosis not present

## 2017-01-01 DIAGNOSIS — Z7984 Long term (current) use of oral hypoglycemic drugs: Secondary | ICD-10-CM | POA: Diagnosis not present

## 2017-01-01 DIAGNOSIS — I10 Essential (primary) hypertension: Secondary | ICD-10-CM | POA: Diagnosis present

## 2017-01-01 DIAGNOSIS — Z881 Allergy status to other antibiotic agents status: Secondary | ICD-10-CM | POA: Diagnosis not present

## 2017-01-01 DIAGNOSIS — Z885 Allergy status to narcotic agent status: Secondary | ICD-10-CM | POA: Diagnosis not present

## 2017-01-01 DIAGNOSIS — N289 Disorder of kidney and ureter, unspecified: Secondary | ICD-10-CM | POA: Diagnosis present

## 2017-01-01 DIAGNOSIS — Z7982 Long term (current) use of aspirin: Secondary | ICD-10-CM | POA: Diagnosis not present

## 2017-01-01 DIAGNOSIS — M25511 Pain in right shoulder: Secondary | ICD-10-CM | POA: Diagnosis present

## 2017-01-01 DIAGNOSIS — Z96611 Presence of right artificial shoulder joint: Secondary | ICD-10-CM

## 2017-01-01 HISTORY — PX: REVERSE SHOULDER ARTHROPLASTY: SHX5054

## 2017-01-01 HISTORY — PX: REVERSE TOTAL SHOULDER ARTHROPLASTY: SHX2344

## 2017-01-01 LAB — GLUCOSE, CAPILLARY
GLUCOSE-CAPILLARY: 104 mg/dL — AB (ref 65–99)
Glucose-Capillary: 128 mg/dL — ABNORMAL HIGH (ref 65–99)
Glucose-Capillary: 115 mg/dL — ABNORMAL HIGH (ref 65–99)

## 2017-01-01 SURGERY — ARTHROPLASTY, SHOULDER, TOTAL, REVERSE
Anesthesia: General | Laterality: Right

## 2017-01-01 MED ORDER — BUPIVACAINE-EPINEPHRINE 0.5% -1:200000 IJ SOLN: INTRAMUSCULAR | Status: DC | PRN

## 2017-01-01 MED ORDER — POLYETHYLENE GLYCOL 3350 17 G PO PACK: 17.0000 g | PACK | Freq: Every day | ORAL | Status: DC | PRN

## 2017-01-01 MED ORDER — FENTANYL CITRATE (PF) 250 MCG/5ML IJ SOLN
INTRAMUSCULAR | Status: AC
  Filled 2017-01-01: qty 5

## 2017-01-01 MED ORDER — METOCLOPRAMIDE HCL 5 MG/ML IJ SOLN: 5.0000 mg | Freq: Three times a day (TID) | INTRAMUSCULAR | Status: DC | PRN

## 2017-01-01 MED ORDER — SODIUM CHLORIDE 0.9 % IR SOLN
Status: DC | PRN
  Administered 2017-01-01: 3000 mL

## 2017-01-01 MED ORDER — ARTIFICIAL TEARS OP OINT
TOPICAL_OINTMENT | OPHTHALMIC | Status: DC | PRN
  Administered 2017-01-01: 1 via OPHTHALMIC

## 2017-01-01 MED ORDER — ATORVASTATIN CALCIUM 20 MG PO TABS
20.0000 mg | ORAL_TABLET | Freq: Every day | ORAL | Status: DC
  Administered 2017-01-01 – 2017-01-02 (×2): 20 mg via ORAL
  Filled 2017-01-01 (×2): qty 1

## 2017-01-01 MED ORDER — MENTHOL 3 MG MT LOZG: 1.0000 | LOZENGE | OROMUCOSAL | Status: DC | PRN

## 2017-01-01 MED ORDER — METFORMIN HCL 500 MG PO TABS
500.0000 mg | ORAL_TABLET | Freq: Two times a day (BID) | ORAL | Status: DC
  Administered 2017-01-01 – 2017-01-02 (×2): 500 mg via ORAL
  Filled 2017-01-01 (×2): qty 1

## 2017-01-01 MED ORDER — BISACODYL 5 MG PO TBEC: 5.0000 mg | DELAYED_RELEASE_TABLET | Freq: Every day | ORAL | Status: DC | PRN

## 2017-01-01 MED ORDER — CEFAZOLIN IN D5W 1 GM/50ML IV SOLN
1.0000 g | Freq: Two times a day (BID) | INTRAVENOUS | Status: DC
  Administered 2017-01-01 – 2017-01-02 (×2): 1 g via INTRAVENOUS
  Filled 2017-01-01 (×3): qty 50

## 2017-01-01 MED ORDER — FENTANYL CITRATE (PF) 100 MCG/2ML IJ SOLN
50.0000 ug | Freq: Once | INTRAMUSCULAR | Status: AC
  Administered 2017-01-01: 50 ug via INTRAVENOUS
  Filled 2017-01-01: qty 1

## 2017-01-01 MED ORDER — LOSARTAN POTASSIUM 50 MG PO TABS
100.0000 mg | ORAL_TABLET | Freq: Every day | ORAL | Status: DC
  Administered 2017-01-01: 100 mg via ORAL
  Filled 2017-01-01 (×2): qty 2

## 2017-01-01 MED ORDER — PHENOL 1.4 % MT LIQD: 1.0000 | OROMUCOSAL | Status: DC | PRN

## 2017-01-01 MED ORDER — LIDOCAINE-EPINEPHRINE (PF) 1.5 %-1:200000 IJ SOLN
INTRAMUSCULAR | Status: DC | PRN
  Administered 2017-01-01: 10 mL via PERINEURAL

## 2017-01-01 MED ORDER — ACETAMINOPHEN 500 MG PO TABS
1000.0000 mg | ORAL_TABLET | Freq: Four times a day (QID) | ORAL | Status: AC
  Administered 2017-01-01 – 2017-01-02 (×4): 1000 mg via ORAL
  Filled 2017-01-01 (×4): qty 2

## 2017-01-01 MED ORDER — OXYCODONE HCL 5 MG PO TABS
5.0000 mg | ORAL_TABLET | ORAL | Status: DC | PRN
  Administered 2017-01-01: 10 mg via ORAL
  Administered 2017-01-02: 5 mg via ORAL
  Administered 2017-01-02 (×4): 10 mg via ORAL
  Filled 2017-01-01 (×5): qty 2
  Filled 2017-01-01: qty 1

## 2017-01-01 MED ORDER — MIDAZOLAM HCL 2 MG/2ML IJ SOLN
INTRAMUSCULAR | Status: AC
  Administered 2017-01-01: 1 mg via INTRAVENOUS
  Filled 2017-01-01: qty 2

## 2017-01-01 MED ORDER — DIPHENHYDRAMINE HCL 12.5 MG/5ML PO ELIX: 12.5000 mg | ORAL_SOLUTION | ORAL | Status: DC | PRN

## 2017-01-01 MED ORDER — PHENYLEPHRINE HCL 10 MG/ML IJ SOLN
INTRAMUSCULAR | Status: DC | PRN
  Administered 2017-01-01: 20 ug/min via INTRAVENOUS

## 2017-01-01 MED ORDER — ROCURONIUM BROMIDE 100 MG/10ML IV SOLN
INTRAVENOUS | Status: DC | PRN
  Administered 2017-01-01: 40 mg via INTRAVENOUS

## 2017-01-01 MED ORDER — FLEET ENEMA 7-19 GM/118ML RE ENEM: 1.0000 | ENEMA | Freq: Once | RECTAL | Status: DC | PRN

## 2017-01-01 MED ORDER — FENTANYL CITRATE (PF) 100 MCG/2ML IJ SOLN
INTRAMUSCULAR | Status: DC | PRN
  Administered 2017-01-01: 50 ug via INTRAVENOUS

## 2017-01-01 MED ORDER — SODIUM CHLORIDE 0.9 % IJ SOLN
INTRAMUSCULAR | Status: DC | PRN
  Administered 2017-01-01: 40 mL via INTRAVENOUS

## 2017-01-01 MED ORDER — BUPIVACAINE HCL (PF) 0.5 % IJ SOLN
INTRAMUSCULAR | Status: DC | PRN
  Administered 2017-01-01: 20 mL via PERINEURAL

## 2017-01-01 MED ORDER — LORATADINE 10 MG PO TABS
10.0000 mg | ORAL_TABLET | Freq: Every day | ORAL | Status: DC
  Administered 2017-01-01 – 2017-01-02 (×2): 10 mg via ORAL
  Filled 2017-01-01 (×2): qty 1

## 2017-01-01 MED ORDER — LIDOCAINE HCL (CARDIAC) 20 MG/ML IV SOLN
INTRAVENOUS | Status: DC | PRN
  Administered 2017-01-01: 60 mg via INTRAVENOUS

## 2017-01-01 MED ORDER — METOCLOPRAMIDE HCL 5 MG PO TABS: 5.0000 mg | ORAL_TABLET | Freq: Three times a day (TID) | ORAL | Status: DC | PRN

## 2017-01-01 MED ORDER — BUPIVACAINE LIPOSOME 1.3 % IJ SUSP
20.0000 mL | Freq: Once | INTRAMUSCULAR | Status: AC
  Administered 2017-01-01: 20 mL
  Filled 2017-01-01: qty 20

## 2017-01-01 MED ORDER — AMLODIPINE BESYLATE 5 MG PO TABS
5.0000 mg | ORAL_TABLET | Freq: Every day | ORAL | Status: DC
  Administered 2017-01-01: 5 mg via ORAL
  Filled 2017-01-01 (×2): qty 1

## 2017-01-01 MED ORDER — SUGAMMADEX SODIUM 200 MG/2ML IV SOLN
INTRAVENOUS | Status: DC | PRN
  Administered 2017-01-01: 200 mg via INTRAVENOUS

## 2017-01-01 MED ORDER — MORPHINE SULFATE (PF) 2 MG/ML IV SOLN
1.0000 mg | INTRAVENOUS | Status: DC | PRN
Start: 2017-01-01 — End: 2017-01-02
  Administered 2017-01-01: 1 mg via INTRAVENOUS
  Filled 2017-01-01: qty 1

## 2017-01-01 MED ORDER — ONDANSETRON HCL 4 MG/2ML IJ SOLN
INTRAMUSCULAR | Status: DC | PRN
  Administered 2017-01-01: 4 mg via INTRAVENOUS

## 2017-01-01 MED ORDER — FENTANYL CITRATE (PF) 100 MCG/2ML IJ SOLN
INTRAMUSCULAR | Status: AC
  Administered 2017-01-01: 50 ug via INTRAVENOUS
  Filled 2017-01-01: qty 2

## 2017-01-01 MED ORDER — SODIUM CHLORIDE 0.9 % IV SOLN
INTRAVENOUS | Status: DC
  Administered 2017-01-01 – 2017-01-02 (×2): via INTRAVENOUS

## 2017-01-01 MED ORDER — FENTANYL CITRATE (PF) 100 MCG/2ML IJ SOLN: 25.0000 ug | INTRAMUSCULAR | Status: DC | PRN

## 2017-01-01 MED ORDER — LACTATED RINGERS IV SOLN
INTRAVENOUS | Status: DC | PRN
  Administered 2017-01-01 (×2): via INTRAVENOUS

## 2017-01-01 MED ORDER — PROPOFOL 10 MG/ML IV BOLUS
INTRAVENOUS | Status: AC
  Filled 2017-01-01: qty 20

## 2017-01-01 MED ORDER — LACTATED RINGERS IV SOLN
INTRAVENOUS | Status: DC
  Administered 2017-01-01: 08:00:00 via INTRAVENOUS

## 2017-01-01 MED ORDER — ONDANSETRON HCL 4 MG/2ML IJ SOLN: 4.0000 mg | Freq: Four times a day (QID) | INTRAMUSCULAR | Status: DC | PRN

## 2017-01-01 MED ORDER — PROPOFOL 10 MG/ML IV BOLUS
INTRAVENOUS | Status: DC | PRN
  Administered 2017-01-01: 100 mg via INTRAVENOUS

## 2017-01-01 MED ORDER — ONDANSETRON HCL 4 MG PO TABS: 4.0000 mg | ORAL_TABLET | Freq: Four times a day (QID) | ORAL | Status: DC | PRN

## 2017-01-01 MED ORDER — 0.9 % SODIUM CHLORIDE (POUR BTL) OPTIME
TOPICAL | Status: DC | PRN
  Administered 2017-01-01: 1000 mL

## 2017-01-01 MED ORDER — ASPIRIN EC 325 MG PO TBEC
325.0000 mg | DELAYED_RELEASE_TABLET | Freq: Every day | ORAL | Status: DC
  Administered 2017-01-01 – 2017-01-02 (×2): 325 mg via ORAL
  Filled 2017-01-01 (×2): qty 1

## 2017-01-01 MED ORDER — PROMETHAZINE HCL 25 MG/ML IJ SOLN: 6.2500 mg | INTRAMUSCULAR | Status: DC | PRN

## 2017-01-01 MED ORDER — ALUM & MAG HYDROXIDE-SIMETH 200-200-20 MG/5ML PO SUSP: 30.0000 mL | ORAL | Status: DC | PRN

## 2017-01-01 MED ORDER — INSULIN ASPART 100 UNIT/ML ~~LOC~~ SOLN
0.0000 [IU] | Freq: Three times a day (TID) | SUBCUTANEOUS | Status: DC
  Administered 2017-01-02 (×2): 2 [IU] via SUBCUTANEOUS

## 2017-01-01 MED ORDER — DOCUSATE SODIUM 100 MG PO CAPS
100.0000 mg | ORAL_CAPSULE | Freq: Two times a day (BID) | ORAL | Status: DC
  Administered 2017-01-01 – 2017-01-02 (×3): 100 mg via ORAL
  Filled 2017-01-01 (×3): qty 1

## 2017-01-01 MED ORDER — POVIDONE-IODINE 7.5 % EX SOLN
Freq: Once | CUTANEOUS | Status: DC
  Filled 2017-01-01: qty 118

## 2017-01-01 MED ORDER — MIDAZOLAM HCL 2 MG/2ML IJ SOLN
1.0000 mg | Freq: Once | INTRAMUSCULAR | Status: AC
Start: 2017-01-01 — End: 2017-01-01
  Administered 2017-01-01: 1 mg via INTRAVENOUS
  Filled 2017-01-01: qty 1

## 2017-01-01 SURGICAL SUPPLY — 84 items
BASEPLATE P2 COATD GLND 6.5X30 (Shoulder) ×1 IMPLANT
BIT DRILL 5/64X5 DISP (BIT) IMPLANT
BLADE SAW SAG 73X25 THK (BLADE) ×1
BLADE SAW SGTL 73X25 THK (BLADE) ×1 IMPLANT
BLADE SURG 15 STRL LF DISP TIS (BLADE) IMPLANT
BLADE SURG 15 STRL SS (BLADE)
BOWL SMART MIX CTS (DISPOSABLE) IMPLANT
CHLORAPREP W/TINT 26ML (MISCELLANEOUS) ×2 IMPLANT
COVER MAYO STAND STRL (DRAPES) IMPLANT
COVER SURGICAL LIGHT HANDLE (MISCELLANEOUS) ×2 IMPLANT
DRAPE INCISE IOBAN 66X45 STRL (DRAPES) ×2 IMPLANT
DRAPE ORTHO SPLIT 77X108 STRL (DRAPES) ×2
DRAPE SURG ORHT 6 SPLT 77X108 (DRAPES) ×2 IMPLANT
DRESSING AQUACEL AG SP 3.5X10 (GAUZE/BANDAGES/DRESSINGS) ×1 IMPLANT
DRSG AQUACEL AG ADV 3.5X10 (GAUZE/BANDAGES/DRESSINGS) ×2 IMPLANT
DRSG AQUACEL AG SP 3.5X10 (GAUZE/BANDAGES/DRESSINGS) ×2
ELECT BLADE 4.0 EZ CLEAN MEGAD (MISCELLANEOUS) ×2
ELECT REM PT RETURN 9FT ADLT (ELECTROSURGICAL) ×2
ELECTRODE BLDE 4.0 EZ CLN MEGD (MISCELLANEOUS) ×1 IMPLANT
ELECTRODE REM PT RTRN 9FT ADLT (ELECTROSURGICAL) ×1 IMPLANT
EVACUATOR 1/8 PVC DRAIN (DRAIN) IMPLANT
GLOVE BIO SURGEON STRL SZ 6.5 (GLOVE) ×4 IMPLANT
GLOVE BIO SURGEON STRL SZ7 (GLOVE) ×4 IMPLANT
GLOVE BIO SURGEON STRL SZ7.5 (GLOVE) ×2 IMPLANT
GLOVE BIOGEL PI IND STRL 6.5 (GLOVE) ×2 IMPLANT
GLOVE BIOGEL PI IND STRL 7.0 (GLOVE) ×1 IMPLANT
GLOVE BIOGEL PI IND STRL 8 (GLOVE) ×1 IMPLANT
GLOVE BIOGEL PI INDICATOR 6.5 (GLOVE) ×2
GLOVE BIOGEL PI INDICATOR 7.0 (GLOVE) ×1
GLOVE BIOGEL PI INDICATOR 8 (GLOVE) ×1
GOWN STRL REUS W/ TWL LRG LVL3 (GOWN DISPOSABLE) ×3 IMPLANT
GOWN STRL REUS W/ TWL XL LVL3 (GOWN DISPOSABLE) ×1 IMPLANT
GOWN STRL REUS W/TWL LRG LVL3 (GOWN DISPOSABLE) ×3
GOWN STRL REUS W/TWL XL LVL3 (GOWN DISPOSABLE) ×1
HANDPIECE INTERPULSE COAX TIP (DISPOSABLE) ×1
HOOD PEEL AWAY FLYTE STAYCOOL (MISCELLANEOUS) ×6 IMPLANT
INSERT EPOLY STND HUMERUS 32MM (Shoulder) ×2 IMPLANT
INSERT EPOLYSTD HUMERUS 32MM (Shoulder) ×1 IMPLANT
KIT BASIN OR (CUSTOM PROCEDURE TRAY) ×2 IMPLANT
KIT ROOM TURNOVER OR (KITS) ×2 IMPLANT
MANIFOLD NEPTUNE II (INSTRUMENTS) ×2 IMPLANT
NEEDLE HYPO 25GX1X1/2 BEV (NEEDLE) IMPLANT
NEEDLE MAYO TROCAR (NEEDLE) IMPLANT
NOZZLE PRISM 8.5MM (MISCELLANEOUS) IMPLANT
NS IRRIG 1000ML POUR BTL (IV SOLUTION) ×2 IMPLANT
P2 COATDE GLNOID BSEPLT 6.5X30 (Shoulder) ×2 IMPLANT
PACK SHOULDER (CUSTOM PROCEDURE TRAY) ×2 IMPLANT
PAD ARMBOARD 7.5X6 YLW CONV (MISCELLANEOUS) ×4 IMPLANT
RESTRAINT HEAD UNIVERSAL NS (MISCELLANEOUS) ×2 IMPLANT
RETRIEVER SUT HEWSON (MISCELLANEOUS) IMPLANT
SCREW BONE LOCKING RSP 5.0X14 (Screw) ×2 IMPLANT
SCREW BONE LOCKING RSP 5.0X34 (Screw) ×2 IMPLANT
SCREW BONE LOCKING RSP 5.0X38 (Screw) ×2 IMPLANT
SCREW BONE RSP LOCK 5X14 (Screw) ×1 IMPLANT
SCREW BONE RSP LOCK 5X18 (Screw) ×1 IMPLANT
SCREW BONE RSP LOCK 5X34 (Screw) ×1 IMPLANT
SCREW BONE RSP LOCK 5X38 (Screw) ×1 IMPLANT
SCREW BONE RSP LOCKING 18MM LG (Screw) ×2 IMPLANT
SCREW RETAIN W/HEAD 4MM OFFSET (Shoulder) ×2 IMPLANT
SET HNDPC FAN SPRY TIP SCT (DISPOSABLE) ×1 IMPLANT
SLING ARM FOAM STRAP LRG (SOFTGOODS) ×2 IMPLANT
SLING ARM FOAM STRAP MED (SOFTGOODS) IMPLANT
SPONGE LAP 18X18 X RAY DECT (DISPOSABLE) IMPLANT
SPONGE LAP 4X18 X RAY DECT (DISPOSABLE) IMPLANT
STEM REV PRIMARY 6X108 (Shoulder) ×2 IMPLANT
STRIP CLOSURE SKIN 1/2X4 (GAUZE/BANDAGES/DRESSINGS) ×2 IMPLANT
SUCTION FRAZIER HANDLE 10FR (MISCELLANEOUS) ×1
SUCTION TUBE FRAZIER 10FR DISP (MISCELLANEOUS) ×1 IMPLANT
SUPPORT WRAP ARM LG (MISCELLANEOUS) ×2 IMPLANT
SUT ETHIBOND NAB CT1 #1 30IN (SUTURE) ×2 IMPLANT
SUT FIBERWIRE #2 38 T-5 BLUE (SUTURE) ×2
SUT MNCRL AB 4-0 PS2 18 (SUTURE) ×2 IMPLANT
SUT SILK 2 0 TIES 17X18 (SUTURE)
SUT SILK 2-0 18XBRD TIE BLK (SUTURE) IMPLANT
SUT VIC AB 2-0 CT1 27 (SUTURE) ×1
SUT VIC AB 2-0 CT1 TAPERPNT 27 (SUTURE) ×1 IMPLANT
SUTURE FIBERWR #2 38 T-5 BLUE (SUTURE) ×1 IMPLANT
SYR CONTROL 10ML LL (SYRINGE) IMPLANT
SYRINGE TOOMEY DISP (SYRINGE) IMPLANT
TAPE FIBER 2MM 7IN #2 BLUE (SUTURE) ×2 IMPLANT
TOWEL OR 17X24 6PK STRL BLUE (TOWEL DISPOSABLE) ×2 IMPLANT
TOWEL OR 17X26 10 PK STRL BLUE (TOWEL DISPOSABLE) ×2 IMPLANT
WATER STERILE IRR 1000ML POUR (IV SOLUTION) ×2 IMPLANT
YANKAUER SUCT BULB TIP NO VENT (SUCTIONS) IMPLANT

## 2017-01-01 NOTE — Discharge Instructions (Signed)

## 2017-01-01 NOTE — H&P (Signed)
Janet Gilbert is an 83 y.o. female.   Chief Complaint:  R shoulder pain and dysfunction HPI: 83 year old female with massive retracted irreparable rotator cuff tear chronic pain which keeps her awake at night and interferes with normal daily activities. She has failed extensive conservative measures. Indicated for reverse total shoulder arthroplasty to try and decrease pain and restore function.  Past Medical History:  Diagnosis Date  . Arthritis   . Asthma    child  . Diabetes mellitus without complication ([place])   . Heart murmur   . History of hiatal hernia   . Hypertension   . Peripheral vascular disease ([place])    left leg  dvt after hysterectomy 47 yrs ago  . Pneumonia    grade school  . Sleep apnea    pos study lost wt and does not use cpap last test 5-6 yrs ago    Past Surgical History:  Procedure Laterality Date  . ABDOMINAL HYSTERECTOMY  1947  . ELBOW SURGERY Right 90's   nerves    History reviewed. No pertinent family history. Social History:  reports that she quit smoking about 40 years ago. Her smoking use included Cigarettes. She quit after 5.00 years of use. She has never used smokeless tobacco. She reports that she does not drink alcohol or use drugs.  Allergies:  Allergies  Allergen Reactions  . Contrast Media [Iodinated Diagnostic Agents] Hives and Shortness Of Breath    Hypaque 60%  . Sulfa Antibiotics Hives and Shortness Of Breath  . Azithromycin Rash  . Codeine Nausea Only  . Darvon [Propoxyphene] Nausea Only    Medications Prior to Admission  Medication Sig Dispense Refill  . acetaminophen (TYLENOL) 500 MG tablet Take 500 mg by mouth every 8 (eight) hours as needed for mild pain.    Marland Kitchen amLODipine (NORVASC) 5 MG tablet Take 5 mg by mouth daily.    Marland Kitchen aspirin EC 81 MG tablet Take 81 mg by mouth daily.    Marland Kitchen atorvastatin (LIPITOR) 20 MG tablet Take 20 mg by mouth daily.    . Cholecalciferol (VITAMIN D3) 5000 units CAPS Take 5,000 Units by mouth daily.     Marland Kitchen CINNAMON PO Take 1,000 mg by mouth 2 (two) times daily.    . Cyanocobalamin (B-12) 2500 MCG TABS Take 2,500 mcg by mouth daily.    Marland Kitchen Histamine Dihydrochloride (AUSTRALIAN DREAM ARTHRITIS) 0.025 % CREA Apply 1 application topically 3 (three) times daily as needed (pain).    Marland Kitchen loratadine (CLARITIN) 10 MG tablet Take 10 mg by mouth daily.    Marland Kitchen losartan (COZAAR) 100 MG tablet Take 100 mg by mouth daily.    . metFORMIN (GLUCOPHAGE) 1000 MG tablet Take 500-1,000 mg by mouth 2 (two) times daily with a meal. Takes 500 mg in the morning and 1000 mg in the evening    . Multiple Vitamin (MULTIVITAMIN WITH MINERALS) TABS tablet Take 1 tablet by mouth daily. Women's 50+    . trolamine salicylate (ASPERCREME) 10 % cream Apply 1 application topically as needed for muscle pain.    . traMADol (ULTRAM) 50 MG tablet Take 1 tablet (50 mg total) by mouth every 6 (six) hours as needed. (Patient not taking: Reported on 12/24/2016) 10 tablet 0    Results for orders placed or performed during the hospital encounter of 01/01/17 (from the past 48 hour(s))  Glucose, capillary     Status: Abnormal   Collection Time: 01/01/17  8:07 AM  Result Value Ref Range   Glucose-Capillary  104 (H) 65 - 99 mg/dL   No results found.  Review of Systems  All other systems reviewed and are negative.   Blood pressure 133/60, pulse 90, temperature 97.9 F (36.6 C), temperature source Oral, resp. rate 18, SpO2 100 %. Physical Exam  Constitutional: She is oriented to person, place, and time. She appears well-developed and well-nourished.  HENT:  Head: Atraumatic.  Eyes: EOM are normal.  Cardiovascular: Intact distal pulses.   Respiratory: Effort normal.  Musculoskeletal:  R shoulder pain with limited ROM, NVID  Neurological: She is alert and oriented to person, place, and time.  Skin: Skin is warm and dry.  Psychiatric: She has a normal mood and affect.     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

## 2017-01-01 NOTE — Anesthesia Postprocedure Evaluation (Signed)
Anesthesia Post Note  Patient: Janet Gilbert  Procedure(s) Performed: Procedure(s) (LRB): REVERSE SHOULDER ARTHROPLASTY (Right)  Patient location during evaluation: PACU Anesthesia Type: General and Regional Level of consciousness: awake and alert Pain management: pain level controlled Vital Signs Assessment: post-procedure vital signs reviewed and stable Respiratory status: spontaneous breathing, nonlabored ventilation, respiratory function stable and patient connected to nasal cannula oxygen Cardiovascular status: blood pressure returned to baseline and stable Postop Assessment: no signs of nausea or vomiting Anesthetic complications: no       Last Vitals:  Vitals:   01/01/17 1230 01/01/17 1248  BP:    Pulse: 79 87  Resp: 16 15  Temp:  36.4 C    Last Pain:  Vitals:   01/01/17 1248  TempSrc:   PainSc: 0-No pain                 Jehad Bisono S

## 2017-01-01 NOTE — Anesthesia Procedure Notes (Signed)
Procedure Name: Intubation Date/Time: 01/01/2017 9:49 AM Performed by: Gaylene Brooks Pre-anesthesia Checklist: Patient identified, Emergency Drugs available, Suction available and Patient being monitored Patient Re-evaluated:Patient Re-evaluated prior to inductionOxygen Delivery Method: Circle System Utilized Preoxygenation: Pre-oxygenation with 100% oxygen Intubation Type: IV induction Ventilation: Mask ventilation without difficulty and Oral airway inserted - appropriate to patient size Laryngoscope Size: Sabra Heck and 2 Grade View: Grade I Tube type: Oral Tube size: 7.0 mm Number of attempts: 1 Airway Equipment and Method: Stylet and Oral airway Placement Confirmation: ETT inserted through vocal cords under direct vision,  positive ETCO2 and breath sounds checked- equal and bilateral Secured at: 22 cm Tube secured with: Tape Dental Injury: Teeth and Oropharynx as per pre-operative assessment

## 2017-01-01 NOTE — Anesthesia Procedure Notes (Signed)
Anesthesia Regional Block: Interscalene brachial plexus block   Pre-Anesthetic Checklist: ,, timeout performed, Correct Patient, Correct Site, Correct Laterality, Correct Procedure, Correct Position, site marked, Risks and benefits discussed,  Surgical consent,  Pre-op evaluation,  At surgeon's request and post-op pain management  Laterality: Right  Prep: chloraprep       Needles:  Injection technique: Single-shot  Needle Type: Echogenic Needle     Needle Length: 9cm      Additional Needles:   Procedures: ultrasound guided,,,,,,,,  Narrative:  Start time: 01/01/2017 9:00 AM End time: 01/01/2017 9:07 AM Injection made incrementally with aspirations every 5 mL.  Performed by: Personally  Anesthesiologist: Amare Bail  Additional Notes: Patient tolerated the procedure well without complications

## 2017-01-01 NOTE — Transfer of Care (Signed)
Immediate Anesthesia Transfer of Care Note  Patient: Janet Gilbert  Procedure(s) Performed: Procedure(s) with comments: REVERSE SHOULDER ARTHROPLASTY (Right) - RIGHT REVERSE SHOULDER ARTHROPLASTY  Patient Location: PACU  Anesthesia Type:GA combined with regional for post-op pain  Level of Consciousness: awake, alert  and oriented  Airway & Oxygen Therapy: Patient Spontanous Breathing  Post-op Assessment: Report given to RN, Post -op Vital signs reviewed and stable and Patient moving all extremities X 4  Post vital signs: Reviewed and stable  Last Vitals:  Vitals:   01/01/17 0817  BP: 133/60  Pulse: 90  Resp: 18  Temp: 36.6 C    Last Pain:  Vitals:   01/01/17 0817  TempSrc: Oral      Patients Stated Pain Goal: 5 (57/50/51 8335)  Complications: No apparent anesthesia complications

## 2017-01-01 NOTE — Op Note (Signed)
Procedure(s): REVERSE SHOULDER ARTHROPLASTY Procedure Note  Janet Gilbert female 83 y.o. 01/01/2017  Procedure(s) and Anesthesia Type:    * R REVERSE SHOULDER ARTHROPLASTY - Choice   Indications:  83 y.o. female  With irrepairable rotator cuff tear and pseudoparalysis, failed nonoperative treatment. Pain and dysfunction interfered with quality of life and nonoperative treatment with activity modification, NSAIDS and injections failed.     Surgeon: Nita Sells   Assistants: Jeanmarie Hubert PA-C [facility] was present and scrubbed throughout the procedure and was essential in positioning, retraction, exposure, and closure)  Anesthesia: General endotracheal anesthesia with preoperative interscalene block given by the attending anesthesiologist.      Procedure Detail   R REVERSE SHOULDER ARTHROPLASTY   Estimated Blood Loss:  223ml         Drains: none  Blood Given: none          Specimens: none        Complications:  * No complications entered in OR log *         Disposition: PACU - hemodynamically stable.         Condition: stable      OPERATIVE FINDINGS:  A DJO Altivate pressfit reverse total shoulder arthroplasty was placed with a  size 6 stem, a 32-4 glenosphere, and a standard poly insert. The base plate  fixation was excellent.  PROCEDURE: The patient was identified in the preoperative holding area  where I personally marked the operative site after verifying site, side,  and procedure with the patient. An interscalene block given by  the attending anesthesiologist in the holding area and the patient was taken back to the operating room where all extremities were  carefully padded in position after general anesthesia was induced. She  was placed in a beach-chair position and the operative upper extremity was  prepped and draped in a standard sterile fashion. An approximately 10-  cm incision was made from the tip of the coracoid process to the  center  point of the humerus at the level of the axilla. Dissection was carried  down through subcutaneous tissues to the level of the cephalic vein  which was taken laterally with the deltoid. The pectoralis major was  retracted medially. The subdeltoid space was developed and the lateral  edge of the conjoined tendon was identified. The undersurface of  conjoined tendon was palpated and the musculocutaneous nerve was not in  the field. Retractor was placed underneath the conjoined and second  retractor was placed lateral into the deltoid. The circumflex humeral  artery and vessels were identified and clamped and coagulated. The  biceps tendon was absent.  The subscapularis was taken down as a peel.  The  joint was then gently externally rotated while the capsule was released  from the humeral neck around to just beyond the 6 o'clock position. At  this point, the joint was dislocated and the humeral head was presented  into the wound. The excessive osteophyte formation was removed with a  large rongeur.  The cutting guide was used to make the appropriate  head cut and the head was saved for potentially bone grafting.  The glenoid was exposed with the arm in an  abducted extended position. The anterior and posterior labrum were  completely excised and the capsule was released circumferentially to  allow for exposure of the glenoid for preparation. The 2.5 mm drill was  placed using the guide in 5-10 inferior angulation and the tap was then advanced in the same  hole. Small and large reamers were then used. The tap was then removed and the Metaglene was then screwed in with excellent purchase.  The peripheral guide was then used to drilled measured and filled peripheral locking screws. The size  32-4  glenosphere was then impacted on the [facility] taper and the central screw was placed. The humerus was then again exposed and the diaphyseal reamers were used followed by the metaphyseal reamers. The  final broach was left in place in the proximal trial was placed. The joint was reduced and with this implant it was felt that soft tissue tensioning was appropriate with excellent stability and excellent range of motion. Therefore, final humeral stem was placed press fit with some bone grafting.  And then the trial polyethylene inserts were tested again and the above implant was felt to be the most appropriate for final insertion. The joint was reduced taken through full range of motion and felt to be stable. Soft tissue tension was appropriate.  The joint was then copiously irrigated with pulse  lavage and the wound was then closed. The subscapularis was partially repaired.  Skin was closed with 2-0 Vicryl in a deep dermal layer and 4-0  Monocryl for skin closure. Steri-Strips were applied. Sterile  dressings were then applied as well as a sling. The patient was allowed  to awaken from general anesthesia, transferred to stretcher, and taken  to recovery room in stable condition.   POSTOPERATIVE PLAN: The patient will be kept in the hospital postoperatively  for pain control and therapy.

## 2017-01-01 NOTE — Anesthesia Preprocedure Evaluation (Signed)
Anesthesia Evaluation  Patient identified by MRN, date of birth, ID band Patient awake    Reviewed: Allergy & Precautions, NPO status , Patient's Chart, lab work & pertinent test results  Airway Mallampati: II  TM Distance: >3 FB Neck ROM: Full    Dental no notable dental hx.    Pulmonary neg pulmonary ROS, former smoker,    Pulmonary exam normal breath sounds clear to auscultation       Cardiovascular hypertension, Normal cardiovascular exam Rhythm:Regular Rate:Normal     Neuro/Psych negative neurological ROS  negative psych ROS   GI/Hepatic negative GI ROS, Neg liver ROS,   Endo/Other  diabetes  Renal/GU negative Renal ROS  negative genitourinary   Musculoskeletal negative musculoskeletal ROS (+)   Abdominal   Peds negative pediatric ROS (+)  Hematology negative hematology ROS (+)   Anesthesia Other Findings   Reproductive/Obstetrics negative OB ROS                             Anesthesia Physical Anesthesia Plan  ASA: II  Anesthesia Plan: General   Post-op Pain Management: GA combined w/ Regional for post-op pain   Induction: Intravenous  Airway Management Planned: Oral ETT  Additional Equipment:   Intra-op Plan:   Post-operative Plan: Extubation in OR  Informed Consent: I have reviewed the patients History and Physical, chart, labs and discussed the procedure including the risks, benefits and alternatives for the proposed anesthesia with the patient or authorized representative who has indicated his/her understanding and acceptance.   Dental advisory given  Plan Discussed with: CRNA and Surgeon  Anesthesia Plan Comments:         Anesthesia Quick Evaluation

## 2017-01-02 ENCOUNTER — Encounter (HOSPITAL_COMMUNITY): Payer: Self-pay | Admitting: General Practice

## 2017-01-02 DIAGNOSIS — E875 Hyperkalemia: Secondary | ICD-10-CM

## 2017-01-02 LAB — BASIC METABOLIC PANEL
Anion gap: 7 (ref 5–15)
Anion gap: 8 (ref 5–15)
Anion gap: 12 (ref 5–15)
BUN: 27 mg/dL — AB (ref 6–20)
BUN: 28 mg/dL — AB (ref 6–20)
BUN: 26 mg/dL — AB (ref 6–20)
CALCIUM: 9 mg/dL (ref 8.9–10.3)
CALCIUM: 8.3 mg/dL — AB (ref 8.9–10.3)
CALCIUM: 8.1 mg/dL — AB (ref 8.9–10.3)
CO2: 23 mmol/L (ref 22–32)
CO2: 22 mmol/L (ref 22–32)
CO2: 20 mmol/L — ABNORMAL LOW (ref 22–32)
CREATININE: 1.93 mg/dL — AB (ref 0.44–1.00)
CREATININE: 1.9 mg/dL — AB (ref 0.44–1.00)
Chloride: 105 mmol/L (ref 101–111)
Chloride: 106 mmol/L (ref 101–111)
Chloride: 107 mmol/L (ref 101–111)
Creatinine, Ser: 1.78 mg/dL — ABNORMAL HIGH (ref 0.44–1.00)
GFR calc Af Amer: 27 mL/min — ABNORMAL LOW (ref >60)
GFR calc Af Amer: 27 mL/min — ABNORMAL LOW (ref >60)
GFR calc Af Amer: 29 mL/min — ABNORMAL LOW (ref >60)
GFR, EST NON AFRICAN AMERICAN: 23 mL/min — AB (ref >60)
GFR, EST NON AFRICAN AMERICAN: 23 mL/min — AB (ref >60)
GFR, EST NON AFRICAN AMERICAN: 25 mL/min — AB (ref >60)
GLUCOSE: 142 mg/dL — AB (ref 65–99)
GLUCOSE: 125 mg/dL — AB (ref 65–99)
Glucose, Bld: 116 mg/dL — ABNORMAL HIGH (ref 65–99)
Potassium: 6.5 mmol/L (ref 3.5–5.1)
Potassium: 5.8 mmol/L — ABNORMAL HIGH (ref 3.5–5.1)
Potassium: 4.5 mmol/L (ref 3.5–5.1)
Sodium: 135 mmol/L (ref 135–145)
Sodium: 136 mmol/L (ref 135–145)
Sodium: 139 mmol/L (ref 135–145)

## 2017-01-02 LAB — CBC
HCT: 31 % — ABNORMAL LOW (ref 36.0–46.0)
Hemoglobin: 10 g/dL — ABNORMAL LOW (ref 12.0–15.0)
MCH: 29.6 pg (ref 26.0–34.0)
MCHC: 32.3 g/dL (ref 30.0–36.0)
MCV: 91.7 fL (ref 78.0–100.0)
PLATELETS: 254 10*3/uL (ref 150–400)
RBC: 3.38 MIL/uL — ABNORMAL LOW (ref 3.87–5.11)
RDW: 13.9 % (ref 11.5–15.5)
WBC: 9.2 10*3/uL (ref 4.0–10.5)

## 2017-01-02 LAB — GLUCOSE, CAPILLARY
Glucose-Capillary: 125 mg/dL — ABNORMAL HIGH (ref 65–99)
Glucose-Capillary: 137 mg/dL — ABNORMAL HIGH (ref 65–99)

## 2017-01-02 MED ORDER — HYDROCODONE-ACETAMINOPHEN 5-325 MG PO TABS: 1.0000 | ORAL_TABLET | ORAL | 0 refills | Status: DC | PRN

## 2017-01-02 MED ORDER — SODIUM CHLORIDE 0.9 % IV BOLUS (SEPSIS)
500.0000 mL | Freq: Once | INTRAVENOUS | Status: AC
  Administered 2017-01-02: 500 mL via INTRAVENOUS

## 2017-01-02 NOTE — Progress Notes (Addendum)
   PATIENT ID: Janet Gilbert   1 Day Post-Op Procedure(s) (LRB): REVERSE SHOULDER ARTHROPLASTY (Right)  Subjective: Reports doing well, pain controlled overnight. No other complaints or concerns.   Objective:  Vitals:   01/02/17 0540 01/02/17 0805  BP: (!) 87/61 (!) 102/46  Pulse: (!) 102   Resp: 15   Temp: 98.7 F (37.1 C)      R UE dressing c/d/I Wiggles fingers, distally NVI  Labs:   Recent Labs  01/02/17 0420  HGB 10.0*   Recent Labs  01/02/17 0420  WBC 9.2  RBC 3.38*  HCT 31.0*  PLT 254   Recent Labs  01/02/17 0420 01/02/17 0646  NA 135 136  K 6.5* 5.8*  CL 105 106  CO2 23 22  BUN 27* 28*  CREATININE 1.93* 1.90*  GLUCOSE 142* 116*  CALCIUM 9.0 8.3*    Assessment and Plan: 1 day s/p Right reverse TSA OT- hand wrist elbow only Continue pain mgmt, ween off IV morphine to po pain rx Kidney dysfunction, K (hyperkalemia) and Creatinine labs higher than baseline, but seem to be trending to baseline when repeated this morning. Not symptomatic and vitals wnl, will continue to monitor and repeat BMP at 2pm and consult medicine and keep overnight if needed If labs improved d/c home this afternoons, scripts in chart Fu with Dr. Tamera Punt in 2 weeks  VTE proph: ASA, SCDs

## 2017-01-02 NOTE — Evaluation (Signed)
Occupational Therapy Evaluation Patient Details Name: Janet Gilbert MRN: [id number] DOB: [birth year] Today's Date: 01/02/2017    History of Present Illness 83 yo female R RSA   Past Medical History:  Diagnosis Date  . Arthritis   . Asthma    child  . Diabetes mellitus without complication ([place])   . Heart murmur   . History of hiatal hernia   . Hypertension   . Peripheral vascular disease ([place])    left leg  dvt after hysterectomy 47 yrs ago  . Pneumonia    grade school  . Sleep apnea    pos study lost wt and does not use cpap last test 5-6 yrs ago      Clinical Impression   Patient is s/p R RSA surgery resulting in functional limitations due to the deficits listed below (see OT problem list). PTA was independent with all adls.  Patient will benefit from skilled OT acutely to increase independence and safety with ADLS to allow discharge home without follow up .     Follow Up Recommendations  No OT follow up    Equipment Recommendations  None recommended by OT    Recommendations for Other Services       Precautions / Restrictions Precautions Precautions: Shoulder Type of Shoulder Precautions: conservative Shoulder Interventions: Off for dressing/bathing/exercises Precaution Comments: wrist hand and elbow AROM only Required Braces or Orthoses: Sling Restrictions Weight Bearing Restrictions: Yes RUE Weight Bearing: Non weight bearing      Mobility Bed Mobility Overal bed mobility: Needs Assistance Bed Mobility: Supine to Sit     Supine to sit: Min assist     General bed mobility comments: (A) to elevate trunk off surface  Transfers Overall transfer level: Needs assistance Equipment used: 1 person hand held assist Transfers: Sit to/from Stand Sit to Stand: Min assist         General transfer comment: needs (A) for balance and transfer to the R side    Balance                                           ADL either performed or  assessed with clinical judgement   ADL Overall ADL's : Needs assistance/impaired Eating/Feeding: Set up   Grooming: Wash/dry hands;Minimal assistance   Upper Body Bathing: Moderate assistance   Lower Body Bathing: Maximal assistance           Toilet Transfer: Moderate assistance           Functional mobility during ADLs: Moderate assistance General ADL Comments: Pt with 118/56 and decreased to 94/67   Pt educated on bathing and avoid washing directly on incision. Pt educated to use new wash cloth and towel each day. Pt educated to allow water to run across dressing and not to soak in a tub at this time.  Handout provided for shoulder adls     Vision Baseline Vision/History: No visual deficits       Perception     Praxis      Pertinent Vitals/Pain Pain Assessment: Faces Faces Pain Scale: Hurts even more Pain Location: R shoulder Pain Descriptors / Indicators: Operative site guarding Pain Intervention(s): Monitored during session;Premedicated before session;Repositioned;Ice applied     Hand Dominance Right   Extremity/Trunk Assessment Upper Extremity Assessment Upper Extremity Assessment: RUE deficits/detail RUE Deficits / Details: s/p surg   Lower Extremity Assessment Lower Extremity Assessment:  Defer to PT evaluation   Cervical / Trunk Assessment Cervical / Trunk Assessment: Normal   Communication Communication Communication: No difficulties   Cognition Arousal/Alertness: Awake/alert Behavior During Therapy: WFL for tasks assessed/performed Overall Cognitive Status: Within Functional Limits for tasks assessed                                     General Comments  bandage intact and dry    Exercises     Shoulder Instructions      Home Living Family/patient expects to be discharged to:: Private residence Living Arrangements: Spouse/significant other;Children Available Help at Discharge: Family Type of Home: House Home Access:  Stairs to enter Technical brewer of Steps: 4 Entrance Stairs-Rails: Left Home Layout: Two level;Able to live on main level with bedroom/bathroom     Bathroom Shower/Tub: Walk-in shower         Home Equipment: None          Prior Functioning/Environment Level of Independence: Independent                 OT Problem List: Decreased strength;Decreased activity tolerance;Impaired balance (sitting and/or standing);Decreased safety awareness;Decreased knowledge of precautions;Decreased knowledge of use of DME or AE;Pain      OT Treatment/Interventions: Self-care/ADL training;Therapeutic exercise;DME and/or AE instruction;Therapeutic activities;Patient/family education;Balance training    OT Goals(Current goals can be found in the care plan section) Acute Rehab OT Goals Patient Stated Goal: none stated OT Goal Formulation: With patient Time For Goal Achievement: 01/16/17 Potential to Achieve Goals: Good  OT Frequency: Min 2X/week   Barriers to D/C:            Co-evaluation              End of Session Nurse Communication: Mobility status;Precautions;Weight bearing status  Activity Tolerance: Patient tolerated treatment well Patient left: in chair;with call bell/phone within reach;with SCD's reapplied (SCD applied due to BP)  OT Visit Diagnosis: Unsteadiness on feet (R26.81)                Time: 6147-0929 OT Time Calculation (min): 35 min Charges:  OT General Charges $OT Visit: 1 Procedure OT Evaluation $OT Eval Moderate Complexity: 1 Procedure OT Treatments $Self Care/Home Management : 8-22 mins G-Codes:      Jeri Modena   OTR/L Pager: [phone] Office: [phone] .   Parke Poisson B 01/02/2017, 1:32 PM

## 2017-01-02 NOTE — Progress Notes (Signed)
PHARMACIST - PHYSICIAN COMMUNICATION  CONCERNING:  METFORMIN SAFE ADMINISTRATION POLICY  RECOMMENDATION: Metformin has been placed on DISCONTINUE (rejected order) STATUS and should be reordered only after any of the conditions below are ruled out.  Current Safety recommendations include avoiding metformin for a minimum of 48 hours after the patient's exposure to intravenous contrast media for the following conditions:  . eGFR < 60 ml/min  . Liver disease, alcoholism, heart failure, intra-arterial administration of contrast  DESCRIPTION:  The Pharmacy Committee has adopted a policy that restricts the use of metformin in hospitalized patients until all the contraindications to administration have been ruled out. Specific contraindications are: _0  Serum creatinine ? 1.5 for males _1  Serum creatinine ? 1.4 for females _2  Shock, acute MI, sepsis, hypoxemia, dehydration _3  Planned administration of intravenous iodinated contrast media when eGFR < 100m/min, Liver Disease, alcoholism, heart failure or intra-arterial administration of contrast _4  Heart Failure patients with low EF _5  Acute or chronic metabolic acidosis (including DKA)   PTad Moore RCentrum Surgery Center Ltd4/13/2018 10:56 AM

## 2017-01-02 NOTE — Progress Notes (Signed)
Reviewed discharge information/medication with patient. Answered her questions.  Called Dr. Tamera Punt and was given the okay to discharge patient.  Patient is ready for discharge.  Waiting on husband to pick her up.

## 2017-01-08 NOTE — Discharge Summary (Signed)
Patient ID: Janet Gilbert MRN: [id number] DOB/AGE: 1932/06/28 83 y.o.  Admit date: 01/01/2017 Discharge date: 01/02/2017   Admission Diagnoses:  Active Problems:   S/P reverse total shoulder arthroplasty, right   Hyperkalemia   Discharge Diagnoses:  Same  Past Medical History:  Diagnosis Date  . Arthritis   . Asthma    child  . Diabetes mellitus without complication ([place])   . Heart murmur   . History of hiatal hernia   . Hypertension   . Peripheral vascular disease ([place])    left leg  dvt after hysterectomy 47 yrs ago  . Pneumonia    grade school  . Sleep apnea    pos study lost wt and does not use cpap last test 5-6 yrs ago    Surgeries: Procedure(s): [place] on 01/01/2017   Consultants:   Discharged Condition: Improved  Hospital Course: Janet Gilbert is an 83 y.o. female who was admitted 01/01/2017 for operative treatment of massive irreparable RCT with chronic pain. . Patient has severe unremitting pain that affects sleep, daily activities, and work/hobbies. After pre-op clearance the patient was taken to the operating room on 01/01/2017 and underwent  Procedure(s): [place].    Patient was given perioperative antibiotics:  Anti-infectives    Start     Dose/Rate Route Frequency Ordered Stop   01/01/17 2200  ceFAZolin (ANCEF) IVPB 1 g/50 mL premix  Status:  Discontinued     1 g 100 mL/hr over 30 Minutes Intravenous Every 12 hours 01/01/17 1314 01/02/17 2025   01/01/17 0930  ceFAZolin (ANCEF) IVPB 2g/100 mL premix     2 g 200 mL/hr over 30 Minutes Intravenous To [facility]Stay [facility] 12/31/16 0940 01/01/17 0955       Patient was given sequential compression devices, early ambulation, and asa to prevent DVT.  Patient benefited maximally from hospital stay and there were no complications. Hyperkalemia and high creatinine monitored and within acceptable limits prior to d/c after saline bolus. She will follow up with PCP.    Recent vital signs: No data found.    Recent laboratory studies: No results for input(s): WBC, HGB, HCT, PLT, NA, K, CL, CO2, BUN, CREATININE, GLUCOSE, INR, CALCIUM in the last 72 hours.  Invalid input(s): PT, 2   Discharge Medications:   Allergies as of 01/02/2017      Reactions   Contrast Media [iodinated Diagnostic Agents] Hives, Shortness Of Breath   Hypaque 60%   Sulfa Antibiotics Hives, Shortness Of Breath   Azithromycin Rash   Codeine Nausea Only   Darvon [propoxyphene] Nausea Only      Medication List    STOP taking these medications   acetaminophen 500 MG tablet Commonly known as:  TYLENOL     TAKE these medications   amLODipine 5 MG tablet Commonly known as:  NORVASC Take 5 mg by mouth daily.   aspirin EC 81 MG tablet Take 81 mg by mouth daily.   atorvastatin 20 MG tablet Commonly known as:  LIPITOR Take 20 mg by mouth daily.   AUSTRALIAN DREAM ARTHRITIS 0.025 % Crea Generic drug:  Histamine Dihydrochloride Apply 1 application topically 3 (three) times daily as needed (pain).   B-12 2500 MCG Tabs Take 2,500 mcg by mouth daily.   CINNAMON PO Take 1,000 mg by mouth 2 (two) times daily.   HYDROcodone-acetaminophen 5-325 MG tablet Commonly known as:  NORCO Take 1-2 tablets by mouth every 4 (four) hours as needed for moderate pain.   loratadine 10  MG tablet Commonly known as:  CLARITIN Take 10 mg by mouth daily.   losartan 100 MG tablet Commonly known as:  COZAAR Take 100 mg by mouth daily.   metFORMIN 1000 MG tablet Commonly known as:  GLUCOPHAGE Take 500-1,000 mg by mouth 2 (two) times daily with a meal. Takes 500 mg in the morning and 1000 mg in the evening   multivitamin with minerals Tabs tablet Take 1 tablet by mouth daily. Women's 50+   traMADol 50 MG tablet Commonly known as:  ULTRAM Take 1 tablet (50 mg total) by mouth every 6 (six) hours as needed.   trolamine salicylate 10 % cream Commonly known as:  ASPERCREME Apply 1  application topically as needed for muscle pain.   Vitamin D3 5000 units Caps Take 5,000 Units by mouth daily.       Diagnostic Studies: Dg Chest 2 View  Result Date: 12/29/2016 CLINICAL DATA:  Preoperative evaluation for RIGHT shoulder replacement, history hypertension, diabetes mellitus, peripheral vascular disease, former smoker, hiatal hernia EXAM: CHEST  2 VIEW COMPARISON:  CT chest 07/24/2016 FINDINGS: Normal heart size, mediastinal contours, and pulmonary vascularity. Lungs clear. No pleural effusion or pneumothorax. Minimal scattered endplate spur formation thoracic spine. Osseous demineralization. Several surgical clips at the RIGHT paratracheal region at the level of the the superior margin of the RIGHT clavicular head. IMPRESSION: No acute abnormalities. Electronically Signed   By: Lavonia Dana M.D.   On: 12/29/2016 10:38   Dg Shoulder Right Port  Result Date: 01/01/2017 CLINICAL DATA:  Status post right reverse shoulder arthroplasty EXAM: PORTABLE RIGHT SHOULDER COMPARISON:  None. FINDINGS: Single portable view of the right shoulder submitted. Right shoulder arthroplasty with anatomic alignment. Postsurgical changes with small periarticular soft tissue air. IMPRESSION: Right shoulder prosthesis with anatomic alignment. Electronically Signed   By: Lahoma Crocker M.D.   On: 01/01/2017 12:09    Disposition: 01-Home or Self Care  Discharge Instructions    Call MD / Call 911    Complete by:  As directed    If you experience chest pain or shortness of breath, CALL 911 and be transported to the hospital emergency room.  If you develope a fever above 101 F, pus (white drainage) or increased drainage or redness at the wound, or calf pain, call your surgeon's office.   Constipation Prevention    Complete by:  As directed    Drink plenty of fluids.  Prune juice may be helpful.  You may use a stool softener, such as Colace (over the counter) 100 mg twice a day.  Use MiraLax (over the counter) for  constipation as needed.   Diet - low sodium heart healthy    Complete by:  As directed    Increase activity slowly as tolerated    Complete by:  As directed       Follow-up Information    Nita Sells, MD. Schedule an appointment as soon as possible for a visit in 2 weeks.   Specialty:  Orthopedic Surgery Contact information: [address] [place] [address] [phone]            Signed: Grier Mitts 01/08/2017, 10:24 AM

## 2017-02-23 NOTE — Anesthesia Postprocedure Evaluation (Signed)
Anesthesia Post Note  Patient: Janet Gilbert  Procedure(s) Performed: Procedure(s) (LRB): REVERSE SHOULDER ARTHROPLASTY (Right)     Anesthesia Post Evaluation  Last Vitals:  Vitals:   01/02/17 1408 01/02/17 1500  BP: (!) 132/48 (!) 121/51  Pulse: (!) 105 (!) 111  Resp:  16  Temp:  36.9 C    Last Pain:  Vitals:   01/02/17 1622  TempSrc:   PainSc: 7                  Shaelyn Decarli S

## 2017-02-23 NOTE — Addendum Note (Signed)
Addendum  created 02/23/17 1314 by Myrtie Soman, MD   Sign clinical note

## 2017-05-11 ENCOUNTER — Encounter (HOSPITAL_COMMUNITY): Payer: Self-pay | Admitting: *Deleted

## 2017-05-11 DIAGNOSIS — R51 Headache: Secondary | ICD-10-CM | POA: Diagnosis present

## 2017-05-11 DIAGNOSIS — I10 Essential (primary) hypertension: Secondary | ICD-10-CM | POA: Insufficient documentation

## 2017-05-11 DIAGNOSIS — Z79899 Other long term (current) drug therapy: Secondary | ICD-10-CM | POA: Insufficient documentation

## 2017-05-11 DIAGNOSIS — H538 Other visual disturbances: Secondary | ICD-10-CM | POA: Insufficient documentation

## 2017-05-11 DIAGNOSIS — Z87891 Personal history of nicotine dependence: Secondary | ICD-10-CM | POA: Insufficient documentation

## 2017-05-11 DIAGNOSIS — R739 Hyperglycemia, unspecified: Secondary | ICD-10-CM | POA: Diagnosis not present

## 2017-05-11 DIAGNOSIS — E875 Hyperkalemia: Secondary | ICD-10-CM | POA: Insufficient documentation

## 2017-05-11 DIAGNOSIS — Z7982 Long term (current) use of aspirin: Secondary | ICD-10-CM | POA: Insufficient documentation

## 2017-05-11 DIAGNOSIS — J45909 Unspecified asthma, uncomplicated: Secondary | ICD-10-CM | POA: Diagnosis not present

## 2017-05-11 LAB — URINALYSIS, ROUTINE W REFLEX MICROSCOPIC
Bilirubin Urine: NEGATIVE
Glucose, UA: NEGATIVE mg/dL
HGB URINE DIPSTICK: NEGATIVE
Ketones, ur: NEGATIVE mg/dL
NITRITE: NEGATIVE
PH: 5 (ref 5.0–8.0)
Protein, ur: 30 mg/dL — AB
SPECIFIC GRAVITY, URINE: 1.017 (ref 1.005–1.030)
Squamous Epithelial / LPF: NONE SEEN

## 2017-05-11 LAB — BASIC METABOLIC PANEL
Anion gap: 11 (ref 5–15)
BUN: 33 mg/dL — AB (ref 6–20)
CALCIUM: 9.1 mg/dL (ref 8.9–10.3)
CO2: 22 mmol/L (ref 22–32)
CREATININE: 1.8 mg/dL — AB (ref 0.44–1.00)
Chloride: 106 mmol/L (ref 101–111)
GFR calc Af Amer: 29 mL/min — ABNORMAL LOW (ref >60)
GFR, EST NON AFRICAN AMERICAN: 25 mL/min — AB (ref >60)
GLUCOSE: 108 mg/dL — AB (ref 65–99)
Potassium: 5.3 mmol/L — ABNORMAL HIGH (ref 3.5–5.1)
SODIUM: 139 mmol/L (ref 135–145)

## 2017-05-11 LAB — CBC
HCT: 33.9 % — ABNORMAL LOW (ref 36.0–46.0)
Hemoglobin: 10.9 g/dL — ABNORMAL LOW (ref 12.0–15.0)
MCH: 29.1 pg (ref 26.0–34.0)
MCHC: 32.2 g/dL (ref 30.0–36.0)
MCV: 90.6 fL (ref 78.0–100.0)
PLATELETS: 296 10*3/uL (ref 150–400)
RBC: 3.74 MIL/uL — ABNORMAL LOW (ref 3.87–5.11)
RDW: 15.6 % — AB (ref 11.5–15.5)
WBC: 6.7 10*3/uL (ref 4.0–10.5)

## 2017-05-11 LAB — CBG MONITORING, ED: GLUCOSE-CAPILLARY: 110 mg/dL — AB (ref 65–99)

## 2017-05-11 NOTE — ED Triage Notes (Signed)
Pt reports elevated blood sugar that started this afternoon. Pt taken Metformin for her glucose. Glucose 280 prior to going to bed. Pt says she "doubled up" on her metformin about 1 hour ago. Pt says she feels itchy all over, having a headache, and urinating frequently. cbg 110 in triage

## 2017-05-12 ENCOUNTER — Emergency Department (HOSPITAL_COMMUNITY)
Admission: EM | Admit: 2017-05-12 | Discharge: 2017-05-12 | Disposition: A | Discharge status: Home / Self Care | Payer: Medicare Other | Attending: Emergency Medicine | Admitting: Emergency Medicine

## 2017-05-12 DIAGNOSIS — R739 Hyperglycemia, unspecified: Secondary | ICD-10-CM

## 2017-05-12 DIAGNOSIS — E875 Hyperkalemia: Secondary | ICD-10-CM

## 2017-05-12 LAB — CBG MONITORING, ED
GLUCOSE-CAPILLARY: 92 mg/dL (ref 65–99)
Glucose-Capillary: 83 mg/dL (ref 65–99)

## 2017-05-12 NOTE — Discharge Instructions (Signed)
Your potassium was slightly elevated at 5.3. Recommend you follow-up with your primary care physician this week to have this rechecked.

## 2017-05-12 NOTE — ED Notes (Signed)
Pt verbalized understanding of d/c instructions and has no further questions. Pt is stable, A&Ox4, VSS.  

## 2017-05-12 NOTE — ED Provider Notes (Signed)
TIME SEEN: 4:21 AM  CHIEF COMPLAINT: Hyperglycemia  HPI: Patient is an 83 year old female with history of non-insulin-dependent diabetes, hypertension who presents emergency department with concerns for hyperglycemia. She states tonight she felt like her blood sugar was going up. She states she had a mild headache, felt like her vision was slightly blurry and felt like she had stuff crawling all over her which is typical symptoms that she has been her blood glucose is going up. She states she measured it and it was 250s. She states she takes 500 of metformin in the morning and 250 mg at night. She states she took an extra 500 mg of metformin tonight and her blood sugar was in the 250s. She waited for several minutes and then rechecked it and it was in the 280s. She states this concerned her and she came to the emergency department. Blood sugar here has been normal. She reports she is completely asymptomatic. No chest pain or shortness of breath. No headache. No numbness, tingling or focal weakness. No vision changes.  ROS: See HPI Constitutional: no fever  Eyes: no drainage  ENT: no runny nose   Cardiovascular:  no chest pain  Resp: no SOB  GI: no vomiting GU: no dysuria Integumentary: no rash  Allergy: no hives  Musculoskeletal: no leg swelling  Neurological: no slurred speech ROS otherwise negative  PAST MEDICAL HISTORY/PAST SURGICAL HISTORY:  Past Medical History:  Diagnosis Date  . Arthritis   . Asthma    child  . Diabetes mellitus without complication ([place])   . Heart murmur   . History of hiatal hernia   . Hypertension   . Peripheral vascular disease ([place])    left leg  dvt after hysterectomy 47 yrs ago  . Pneumonia    grade school  . Sleep apnea    pos study lost wt and does not use cpap last test 5-6 yrs ago    MEDICATIONS:  Prior to Admission medications   Medication Sig Start Date End Date Taking? Authorizing Provider  amLODipine (NORVASC) 5 MG tablet Take 5 mg by  mouth daily.    [provider]  aspirin EC 81 MG tablet Take 81 mg by mouth daily.    [provider]  atorvastatin (LIPITOR) 20 MG tablet Take 20 mg by mouth daily.    [provider]  Cholecalciferol (VITAMIN D3) 5000 units CAPS Take 5,000 Units by mouth daily.    [provider]  CINNAMON PO Take 1,000 mg by mouth 2 (two) times daily.    [provider]  Cyanocobalamin (B-12) 2500 MCG TABS Take 2,500 mcg by mouth daily.    [provider]  Histamine Dihydrochloride (AUSTRALIAN DREAM ARTHRITIS) 0.025 % CREA Apply 1 application topically 3 (three) times daily as needed (pain).    [provider]  HYDROcodone-acetaminophen (NORCO) 5-325 MG tablet Take 1-2 tablets by mouth every 4 (four) hours as needed for moderate pain. 01/02/17   Grier Mitts, PA-C  loratadine (CLARITIN) 10 MG tablet Take 10 mg by mouth daily.    [provider]  losartan (COZAAR) 100 MG tablet Take 100 mg by mouth daily.    [provider]  metFORMIN (GLUCOPHAGE) 1000 MG tablet Take 500-1,000 mg by mouth 2 (two) times daily with a meal. Takes 500 mg in the morning and 1000 mg in the evening    [provider]  Multiple Vitamin (MULTIVITAMIN WITH MINERALS) TABS tablet Take 1 tablet by mouth daily. Women's 50+  [provider]  traMADol (ULTRAM) 50 MG tablet Take 1 tablet (50 mg total) by mouth every 6 (six) hours as needed. Patient not taking: Reported on 12/24/2016 07/24/16   Tegeler, Gwenyth Allegra, MD  trolamine salicylate (ASPERCREME) 10 % cream Apply 1 application topically as needed for muscle pain.    [provider]    ALLERGIES:  Allergies  Allergen Reactions  . Contrast Media [Iodinated Diagnostic Agents] Hives and Shortness Of Breath    Hypaque 60%  . Sulfa Antibiotics Hives and Shortness Of Breath  . Azithromycin Rash  . Codeine Nausea Only  . Darvon [Propoxyphene] Nausea Only    SOCIAL HISTORY:   Social History  Substance Use Topics  . Smoking status: Former Smoker    Years: 5.00    Types: Cigarettes    Quit date: 12/29/1976  . Smokeless tobacco: Never Used     Comment: few a day  . Alcohol use No    FAMILY HISTORY: No family history on file.  EXAM: BP 140/66   Pulse 90   Temp 97.8 F (36.6 C) (Oral)   Resp 16   SpO2 98%  CONSTITUTIONAL: Alert and oriented and responds appropriately to questions. Well-appearing; well-nourished, Appears much younger than stated age HEAD: Normocephalic EYES: Conjunctivae clear, pupils appear equal, EOMI ENT: normal nose; moist mucous membranes NECK: Supple, no meningismus, no nuchal rigidity, no LAD  CARD: RRR; S1 and S2 appreciated; no murmurs, no clicks, no rubs, no gallops RESP: Normal chest excursion without splinting or tachypnea; breath sounds clear and equal bilaterally; no wheezes, no rhonchi, no rales, no hypoxia or respiratory distress, speaking full sentences ABD/GI: Normal bowel sounds; non-distended; soft, non-tender, no rebound, no guarding, no peritoneal signs, no hepatosplenomegaly BACK:  The back appears normal and is non-tender to palpation, there is no CVA tenderness EXT: Normal ROM in all joints; non-tender to palpation; no edema; normal capillary refill; no cyanosis, no calf tenderness or swelling    SKIN: Normal color for age and race; warm; no rash NEURO: Moves all extremities equally PSYCH: The patient's mood and manner are appropriate. Grooming and personal hygiene are appropriate.  MEDICAL DECISION MAKING: Patient here with concerns for hyperglycemia. Blood glucose here has been completely normal. She does have a mild hyperkalemia which she has had in the past and likely because of mild chronic kidney disease. Kidney function is unchanged. EKG shows no new ischemic abnormality, interval changes or arrhythmia. Potassium is only mildly elevated, I feel this can be rechecked by her PCP in the next 1-2 days. She is not  on any potassium supplements. Patient is comfortable with this plan. No sign of DKA today. She will resume her metformin as prescribed. I feel she is safe to be discharged home.  At this time, I do not feel there is any life-threatening condition present. I have reviewed and discussed all results (EKG, imaging, lab, urine as appropriate) and exam findings with patient/family. I have reviewed nursing notes and appropriate previous records.  I feel the patient is safe to be discharged home without further emergent workup and can continue workup as an outpatient as needed. Discussed usual and customary return precautions. Patient/family verbalize understanding and are comfortable with this plan.  Outpatient follow-up has been provided if needed. All questions have been answered.     EKG Interpretation  Date/Time:  Tuesday May 12 2017 04:33:24 EDT Ventricular Rate:  87 PR Interval:    QRS Duration: 129 QT Interval:  405 QTC Calculation: 488 R  Axis:   -70 Text Interpretation:  Sinus rhythm RBBB and LAFB Borderline ST elevation, anterolateral leads No significant change since last tracing Confirmed by Rontavious Albright, Cyril Mourning [phone]) on 05/12/2017 4:36:04 AM         Vernica Wachtel, Delice Bison, DO 05/12/17 8115

## 2018-01-14 ENCOUNTER — Other Ambulatory Visit: Payer: Self-pay | Admitting: Internal Medicine

## 2018-01-14 DIAGNOSIS — R222 Localized swelling, mass and lump, trunk: Secondary | ICD-10-CM

## 2018-01-14 DIAGNOSIS — N2889 Other specified disorders of kidney and ureter: Secondary | ICD-10-CM

## 2018-01-16 ENCOUNTER — Other Ambulatory Visit: Payer: Medicare Other

## 2018-01-16 ENCOUNTER — Ambulatory Visit
Admission: RE | Admit: 2018-01-16 | Discharge: 2018-01-16 | Disposition: A | Discharge status: Home / Self Care | Payer: Medicare Other | Source: Ambulatory Visit | Attending: Internal Medicine | Admitting: Internal Medicine

## 2018-01-16 DIAGNOSIS — R222 Localized swelling, mass and lump, trunk: Secondary | ICD-10-CM

## 2018-01-16 DIAGNOSIS — N2889 Other specified disorders of kidney and ureter: Secondary | ICD-10-CM

## 2018-03-23 ENCOUNTER — Encounter (HOSPITAL_COMMUNITY): Payer: Self-pay | Admitting: Emergency Medicine

## 2018-03-23 ENCOUNTER — Emergency Department (HOSPITAL_COMMUNITY): Payer: Medicare Other

## 2018-03-23 ENCOUNTER — Other Ambulatory Visit: Payer: Self-pay

## 2018-03-23 ENCOUNTER — Emergency Department (HOSPITAL_COMMUNITY)
Admission: EM | Admit: 2018-03-23 | Discharge: 2018-03-24 | Disposition: A | Discharge status: Home / Self Care | Payer: Medicare Other | Attending: Emergency Medicine | Admitting: Emergency Medicine

## 2018-03-23 DIAGNOSIS — Y92003 Bedroom of unspecified non-institutional (private) residence as the place of occurrence of the external cause: Secondary | ICD-10-CM | POA: Diagnosis not present

## 2018-03-23 DIAGNOSIS — Z7982 Long term (current) use of aspirin: Secondary | ICD-10-CM | POA: Diagnosis not present

## 2018-03-23 DIAGNOSIS — S42292A Other displaced fracture of upper end of left humerus, initial encounter for closed fracture: Secondary | ICD-10-CM | POA: Diagnosis not present

## 2018-03-23 DIAGNOSIS — S4992XA Unspecified injury of left shoulder and upper arm, initial encounter: Secondary | ICD-10-CM | POA: Diagnosis present

## 2018-03-23 DIAGNOSIS — Z7984 Long term (current) use of oral hypoglycemic drugs: Secondary | ICD-10-CM | POA: Diagnosis not present

## 2018-03-23 DIAGNOSIS — S42142A Displaced fracture of glenoid cavity of scapula, left shoulder, initial encounter for closed fracture: Secondary | ICD-10-CM | POA: Insufficient documentation

## 2018-03-23 DIAGNOSIS — W01198A Fall on same level from slipping, tripping and stumbling with subsequent striking against other object, initial encounter: Secondary | ICD-10-CM | POA: Insufficient documentation

## 2018-03-23 DIAGNOSIS — Y9389 Activity, other specified: Secondary | ICD-10-CM | POA: Diagnosis not present

## 2018-03-23 DIAGNOSIS — Y998 Other external cause status: Secondary | ICD-10-CM | POA: Diagnosis not present

## 2018-03-23 DIAGNOSIS — Z87891 Personal history of nicotine dependence: Secondary | ICD-10-CM | POA: Insufficient documentation

## 2018-03-23 DIAGNOSIS — J45909 Unspecified asthma, uncomplicated: Secondary | ICD-10-CM | POA: Insufficient documentation

## 2018-03-23 DIAGNOSIS — S42152A Displaced fracture of neck of scapula, left shoulder, initial encounter for closed fracture: Secondary | ICD-10-CM | POA: Insufficient documentation

## 2018-03-23 DIAGNOSIS — S43015A Anterior dislocation of left humerus, initial encounter: Secondary | ICD-10-CM | POA: Diagnosis not present

## 2018-03-23 DIAGNOSIS — R09A2 Foreign body sensation, throat: Secondary | ICD-10-CM | POA: Insufficient documentation

## 2018-03-23 DIAGNOSIS — K219 Gastro-esophageal reflux disease without esophagitis: Secondary | ICD-10-CM | POA: Insufficient documentation

## 2018-03-23 DIAGNOSIS — E119 Type 2 diabetes mellitus without complications: Secondary | ICD-10-CM | POA: Diagnosis not present

## 2018-03-23 DIAGNOSIS — R490 Dysphonia: Secondary | ICD-10-CM | POA: Insufficient documentation

## 2018-03-23 MED ORDER — KETAMINE HCL 10 MG/ML IJ SOLN
0.5000 mg/kg | Freq: Once | INTRAMUSCULAR | Status: AC
  Administered 2018-03-23: 31 mg via INTRAVENOUS
  Filled 2018-03-23: qty 1

## 2018-03-23 MED ORDER — PROPOFOL 10 MG/ML IV BOLUS
0.2500 mg/kg | Freq: Once | INTRAVENOUS | Status: AC
  Administered 2018-03-23: 15.3 mg via INTRAVENOUS
  Filled 2018-03-23: qty 20

## 2018-03-23 MED ORDER — HYDROCODONE-ACETAMINOPHEN 5-325 MG PO TABS: 1.0000 | ORAL_TABLET | Freq: Four times a day (QID) | ORAL | 0 refills | Status: DC | PRN

## 2018-03-23 MED ORDER — FENTANYL CITRATE (PF) 100 MCG/2ML IJ SOLN
100.0000 ug | Freq: Once | INTRAMUSCULAR | Status: AC
  Administered 2018-03-23: 100 ug via INTRAVENOUS
  Filled 2018-03-23: qty 2

## 2018-03-23 MED ORDER — DOCUSATE SODIUM 250 MG PO CAPS: 250.0000 mg | ORAL_CAPSULE | Freq: Every day | ORAL | 0 refills | Status: DC

## 2018-03-23 MED ORDER — ONDANSETRON HCL 4 MG/2ML IJ SOLN
4.0000 mg | Freq: Once | INTRAMUSCULAR | Status: AC
  Administered 2018-03-23: 4 mg via INTRAVENOUS
  Filled 2018-03-23: qty 2

## 2018-03-23 MED ORDER — FENTANYL CITRATE (PF) 100 MCG/2ML IJ SOLN
50.0000 ug | Freq: Once | INTRAMUSCULAR | Status: AC
  Administered 2018-03-23: 50 ug via INTRAVENOUS
  Filled 2018-03-23: qty 2

## 2018-03-23 NOTE — ED Provider Notes (Signed)
[facility] EMERGENCY DEPARTMENT Provider Note   CSN: [id number] Arrival date & time: 03/23/18  1959     History   Chief Complaint Chief Complaint  Patient presents with  . Arm Pain    HPI Janet Gilbert is a 84 y.o. female who presents the emergency department with acute left shoulder injury after mechanical fall.  Patient states that she stood up off her bed and got tripped up fell onto her left shoulder.  She had immediate severe pain and noticed that it was hanging lower than the other shoulder and thought that her shoulder must be dislocated.  Patient was brought to the emergency department where initial imaging shows acute anterior dislocation.  She denies numbness or tingling.  She has no previous injuries to the left shoulder  HPI  Past Medical History:  Diagnosis Date  . Arthritis   . Asthma    child  . Diabetes mellitus without complication ([place])   . Heart murmur   . History of hiatal hernia   . Hypertension   . Peripheral vascular disease ([place])    left leg  dvt after hysterectomy 47 yrs ago  . Pneumonia    grade school  . Sleep apnea    pos study lost wt and does not use cpap last test 5-6 yrs ago    Patient Active Problem List   Diagnosis Date Noted  . Hyperkalemia 01/02/2017  . S/P reverse total shoulder arthroplasty, right 01/01/2017    Past Surgical History:  Procedure Laterality Date  . ABDOMINAL HYSTERECTOMY  1947  . ELBOW SURGERY Right 90's   nerves  . REVERSE SHOULDER ARTHROPLASTY Right 01/01/2017   Procedure: REVERSE SHOULDER ARTHROPLASTY;  Surgeon: Tania Ade, MD;  Location: [place];  Service: Orthopedics;  Laterality: Right;  RIGHT REVERSE SHOULDER ARTHROPLASTY  . REVERSE TOTAL SHOULDER ARTHROPLASTY Right 01/01/2017     OB History   None      Home Medications    Prior to Admission medications   Medication Sig Start Date End Date Taking? Authorizing Provider  amLODipine (NORVASC) 5 MG tablet Take 5 mg by mouth  daily.    [provider]  aspirin EC 81 MG tablet Take 81 mg by mouth daily.    [provider]  atorvastatin (LIPITOR) 20 MG tablet Take 20 mg by mouth daily.    [provider]  Cholecalciferol (VITAMIN D3) 5000 units CAPS Take 5,000 Units by mouth daily.    [provider]  CINNAMON PO Take 1,000 mg by mouth 2 (two) times daily.    [provider]  Cyanocobalamin (B-12) 2500 MCG TABS Take 2,500 mcg by mouth daily.    [provider]  Histamine Dihydrochloride (AUSTRALIAN DREAM ARTHRITIS) 0.025 % CREA Apply 1 application topically 3 (three) times daily as needed (pain).    [provider]  HYDROcodone-acetaminophen (NORCO) 5-325 MG tablet Take 1-2 tablets by mouth every 4 (four) hours as needed for moderate pain. 01/02/17   Grier Mitts, PA-C  loratadine (CLARITIN) 10 MG tablet Take 10 mg by mouth daily.    [provider]  losartan (COZAAR) 100 MG tablet Take 100 mg by mouth daily.    [provider]  metFORMIN (GLUCOPHAGE) 1000 MG tablet Take 500-1,000 mg by mouth 2 (two) times daily with a meal. Takes 500 mg in the morning and 1000 mg in the evening    [provider]  Multiple Vitamin (MULTIVITAMIN WITH MINERALS) TABS tablet Take 1 tablet by  mouth daily. Women's 50+    [provider]  traMADol (ULTRAM) 50 MG tablet Take 1 tablet (50 mg total) by mouth every 6 (six) hours as needed. Patient not taking: Reported on 12/24/2016 07/24/16   Tegeler, Gwenyth Allegra, MD  trolamine salicylate (ASPERCREME) 10 % cream Apply 1 application topically as needed for muscle pain.    [provider]    Family History No family history on file.  Social History Social History   Tobacco Use  . Smoking status: Former Smoker    Years: 5.00    Types: Cigarettes    Last attempt to quit: 12/29/1976    Years since quitting: 41.2  . Smokeless tobacco: Never Used  . Tobacco comment: few a day   Substance Use Topics  . Alcohol use: No  . Drug use: No     Allergies   Contrast media [iodinated diagnostic agents]; Sulfa antibiotics; Azithromycin; Codeine; and Darvon [propoxyphene]   Review of Systems Review of Systems  Ten systems reviewed and are negative for acute change, except as noted in the HPI.   Physical Exam Updated Vital Signs BP (!) 198/95   Pulse (!) 108   Temp 98 F (36.7 C) (Oral)   Resp 20   Ht 5\' 2"  (1.575 m)   Wt 61.2 kg (135 lb)   SpO2 96%   BMI 24.69 kg/m   Physical Exam  Constitutional: She is oriented to person, place, and time. She appears well-developed and well-nourished. No distress.  HENT:  Head: Normocephalic and atraumatic.  Eyes: Conjunctivae are normal. No scleral icterus.  Neck: Normal range of motion.  Cardiovascular: Normal rate, regular rhythm and normal heart sounds. Exam reveals no gallop and no friction rub.  No murmur heard. Pulmonary/Chest: Effort normal and breath sounds normal. No respiratory distress.  Abdominal: Soft. Bowel sounds are normal. She exhibits no distension and no mass. There is no tenderness. There is no guarding.  Musculoskeletal:  Left shoulder deformity noted with positive sulcus sign, exquisitely tender to palpation.  Normal left radial pulse and sensation  Neurological: She is alert and oriented to person, place, and time.  Skin: Skin is warm and dry. She is not diaphoretic.  Psychiatric: Her behavior is normal.  Nursing note and vitals reviewed.    ED Treatments / Results  Labs (all labs ordered are listed, but only abnormal results are displayed) Labs Reviewed - No data to display  EKG None  Radiology Dg Shoulder Left  Result Date: 03/23/2018 CLINICAL DATA:  Initial evaluation for acute trauma, fall. EXAM: LEFT SHOULDER - 2+ VIEW COMPARISON:  None. FINDINGS: Left humeral head is dislocated anteriorly and inferiorly relative to the glenoid. No definite acute fracture. AC joint remains  approximated. Osteoarthritic changes noted about the left AC and glenohumeral joints. No acute soft tissue abnormality. Left hemithorax clear. IMPRESSION: Acute anterior inferior dislocation of the left glenohumeral joint. Electronically Signed   By: Jeannine Boga M.D.   On: 03/23/2018 20:48   Dg Shoulder Left Portable  Result Date: 03/23/2018 CLINICAL DATA:  Postreduction of left shoulder dislocation. EXAM: LEFT SHOULDER - 1 VIEW COMPARISON:  03/23/2018 FINDINGS: Anatomic position of the left glenohumeral joint is demonstrated postreduction. There is a displaced fracture fragment off of the posterior humeral head likely representing an Hill-Sachs fragment. There is another fracture fragment projected over the acromioclavicular joint likely representing a displaced fragment from the glenoid. Coracoclavicular and acromioclavicular spaces appear maintained. IMPRESSION: Anatomic position of the left glenohumeral joint consistent with successful  reduction of previous dislocation. Displaced fracture fragments as discussed likely represent Hill-Sachs and glenoid fractures. Electronically Signed   By: Lucienne Capers M.D.   On: 03/23/2018 23:07    Procedures Reduction of dislocation Date/Time: 03/23/2018 11:16 PM Performed by: Margarita Mail, PA-C Authorized by: Margarita Mail, PA-C  Consent: Verbal consent obtained. Written consent obtained. Risks and benefits: risks, benefits and alternatives were discussed Consent given by: patient Patient understanding: patient states understanding of the procedure being performed Patient consent: the patient's understanding of the procedure matches consent given Procedure consent: procedure consent matches procedure scheduled Site marked: the operative site was marked Imaging studies: imaging studies available Patient identity confirmed: verbally with patient and provided demographic data Time out: Immediately prior to procedure a "time out" was called to  verify the correct patient, procedure, equipment, support staff and site/side marked as required.  Sedation: Patient sedated: yes (see note by Dr. Jeanell Sparrow.) Vitals: Vital signs were monitored during sedation.  Patient tolerance: Patient tolerated the procedure well with no immediate complications    (including critical care time)  Medications Ordered in ED Medications  fentaNYL (SUBLIMAZE) injection 100 mcg (100 mcg Intravenous Given 03/23/18 2110)  ondansetron (ZOFRAN) injection 4 mg (4 mg Intravenous Given 03/23/18 2110)  propofol (DIPRIVAN) 10 mg/mL bolus/IV push 15.3 mg (15.3 mg Intravenous Given 03/23/18 2235)  ketamine (KETALAR) injection 31 mg (31 mg Intravenous Given 03/23/18 2237)  fentaNYL (SUBLIMAZE) injection 50 mcg (50 mcg Intravenous Given 03/23/18 2213)     Initial Impression / Assessment and Plan / ED Course  I have reviewed the triage vital signs and the nursing notes.  Pertinent labs & imaging results that were available during my care of the patient were reviewed by me and considered in my medical decision making (see chart for details).    Patient with successful reduction of the shoulder.  Patient was exquisitely tender prior reduction and had some fractures noted with the initial imaging.  Patient will be discharged in sling immobilizer.  She is neurovascularly intact.  She is to follow-up with Dr. Tamera Punt who has previously done surgeries on the other shoulder.  She appears appropriate for discharge at this time.  Final Clinical Impressions(s) / ED Diagnoses   Final diagnoses:  Anterior dislocation of left shoulder, initial encounter  Closed Hill-Sachs fracture of left humerus, initial encounter  Closed fracture of glenoid cavity and neck of left scapula, initial encounter    ED Discharge Orders    None       Margarita Mail, PA-C 03/23/18 2345    Pattricia Boss, MD 03/24/18 [phone]

## 2018-03-23 NOTE — ED Provider Notes (Signed)
.  Sedation Date/Time: 03/23/2018 10:35 PM Performed by: Pattricia Boss, MD Authorized by: Pattricia Boss, MD   Consent:    Consent obtained:  Written   Consent given by:  Patient   Risks discussed:  Allergic reaction, inadequate sedation, nausea, vomiting and respiratory compromise necessitating ventilatory assistance and intubation   Alternatives discussed:  Analgesia without sedation Universal protocol:    Procedure explained and questions answered to patient or proxy's satisfaction: yes     Relevant documents present and verified: yes     Immediately prior to procedure a time out was called: yes   Indications:    Procedure performed:  Dislocation reduction   Procedure necessitating sedation performed by:  Physician performing sedation   Intended level of sedation:  Deep Pre-sedation assessment:    Time since last food or drink:  0   NPO status caution: urgency dictates proceeding with non-ideal NPO status     ASA classification: class 2 - patient with mild systemic disease     Neck mobility: normal     Mouth opening:  3 or more finger widths   Mallampati score:  II - soft palate, uvula, fauces visible   Pre-sedation assessments completed and reviewed: airway patency, cardiovascular function, hydration status, mental status and respiratory function   Immediate pre-procedure details:    Reassessment: Patient reassessed immediately prior to procedure     Reviewed: vital signs and NPO status     Verified: bag valve mask available, emergency equipment available, intubation equipment available, IV patency confirmed, oxygen available, reversal medications available and suction available   Procedure details (see MAR for exact dosages):    Preoxygenation:  Nasal cannula   Sedation:  Ketamine and propofol   Analgesia:  Fentanyl   Intra-procedure monitoring:  Blood pressure monitoring, cardiac monitor, continuous capnometry, frequent LOC assessments and continuous pulse oximetry  Intra-procedure events: none     Total Provider sedation time (minutes):  15 Post-procedure details:    Post-sedation assessment completed:  03/23/2018 11:16 PM   Attendance: Constant attendance by certified staff until patient recovered     Recovery: Patient returned to pre-procedure baseline     Post-sedation assessments completed and reviewed: airway patency, mental status and respiratory function     Patient is stable for discharge or admission: yes     Patient tolerance:  Tolerated well, no immediate complications      Pattricia Boss, MD 03/23/18 2316

## 2018-03-23 NOTE — ED Notes (Signed)
ED Provider at bedside. 

## 2018-03-23 NOTE — ED Notes (Signed)
Patient transported to X-ray 

## 2018-03-23 NOTE — Discharge Instructions (Addendum)
Please immobilizer on until you follow-up with Dr. Tamera Punt.  You may remove it gently for bathing and washing and then place it back on.  Contact a health care provider if: Your splint or sling gets damaged. Get help right away if: Your pain gets worse rather than better. You lose feeling in your arm or hand. Your arm or hand becomes white and cold.

## 2018-03-23 NOTE — Sedation Documentation (Signed)
Xray bedside.

## 2018-03-23 NOTE — ED Triage Notes (Signed)
Pt reports she tripped and fell landing on her L shoulder earlier this evening. Pt has makeshift sling in place.

## 2018-04-06 ENCOUNTER — Other Ambulatory Visit: Payer: Self-pay | Admitting: Orthopedic Surgery

## 2018-04-06 DIAGNOSIS — S42142A Displaced fracture of glenoid cavity of scapula, left shoulder, initial encounter for closed fracture: Secondary | ICD-10-CM

## 2018-04-08 ENCOUNTER — Ambulatory Visit
Admission: RE | Admit: 2018-04-08 | Discharge: 2018-04-08 | Disposition: A | Discharge status: Home / Self Care | Payer: Medicare Other | Source: Ambulatory Visit | Attending: Orthopedic Surgery | Admitting: Orthopedic Surgery

## 2018-04-08 ENCOUNTER — Ambulatory Visit: Admission: RE | Admit: 2018-04-08 | Payer: Medicare Other | Source: Ambulatory Visit

## 2018-04-08 ENCOUNTER — Other Ambulatory Visit: Payer: Self-pay | Admitting: Orthopedic Surgery

## 2018-04-08 DIAGNOSIS — S42142A Displaced fracture of glenoid cavity of scapula, left shoulder, initial encounter for closed fracture: Secondary | ICD-10-CM

## 2018-04-21 ENCOUNTER — Other Ambulatory Visit: Payer: Self-pay | Admitting: Physician Assistant

## 2018-04-21 DIAGNOSIS — R1314 Dysphagia, pharyngoesophageal phase: Secondary | ICD-10-CM

## 2018-04-21 DIAGNOSIS — K219 Gastro-esophageal reflux disease without esophagitis: Secondary | ICD-10-CM

## 2018-04-22 ENCOUNTER — Ambulatory Visit
Admission: RE | Admit: 2018-04-22 | Discharge: 2018-04-22 | Disposition: A | Discharge status: Home / Self Care | Payer: Medicare Other | Source: Ambulatory Visit | Attending: Physician Assistant | Admitting: Physician Assistant

## 2018-04-22 DIAGNOSIS — R1314 Dysphagia, pharyngoesophageal phase: Secondary | ICD-10-CM

## 2018-04-22 DIAGNOSIS — K219 Gastro-esophageal reflux disease without esophagitis: Secondary | ICD-10-CM

## 2019-10-30 ENCOUNTER — Ambulatory Visit: Payer: Medicare Other

## 2019-11-16 ENCOUNTER — Ambulatory Visit: Payer: Medicare Other

## 2019-12-20 ENCOUNTER — Other Ambulatory Visit: Payer: Self-pay | Admitting: Internal Medicine

## 2019-12-20 DIAGNOSIS — E1122 Type 2 diabetes mellitus with diabetic chronic kidney disease: Secondary | ICD-10-CM

## 2019-12-30 ENCOUNTER — Ambulatory Visit
Admission: RE | Admit: 2019-12-30 | Discharge: 2019-12-30 | Disposition: A | Discharge status: Home / Self Care | Payer: Medicare Other | Source: Ambulatory Visit | Attending: Internal Medicine | Admitting: Internal Medicine

## 2019-12-30 DIAGNOSIS — E1122 Type 2 diabetes mellitus with diabetic chronic kidney disease: Secondary | ICD-10-CM

## 2020-06-20 ENCOUNTER — Emergency Department (HOSPITAL_COMMUNITY): Payer: Medicare Other

## 2020-06-20 ENCOUNTER — Other Ambulatory Visit: Payer: Self-pay

## 2020-06-20 ENCOUNTER — Encounter (HOSPITAL_COMMUNITY): Payer: Self-pay

## 2020-06-20 DIAGNOSIS — Z79899 Other long term (current) drug therapy: Secondary | ICD-10-CM | POA: Insufficient documentation

## 2020-06-20 DIAGNOSIS — Z7982 Long term (current) use of aspirin: Secondary | ICD-10-CM | POA: Diagnosis not present

## 2020-06-20 DIAGNOSIS — Z96611 Presence of right artificial shoulder joint: Secondary | ICD-10-CM | POA: Insufficient documentation

## 2020-06-20 DIAGNOSIS — Z7984 Long term (current) use of oral hypoglycemic drugs: Secondary | ICD-10-CM | POA: Insufficient documentation

## 2020-06-20 DIAGNOSIS — Z20822 Contact with and (suspected) exposure to covid-19: Secondary | ICD-10-CM | POA: Diagnosis not present

## 2020-06-20 DIAGNOSIS — R42 Dizziness and giddiness: Secondary | ICD-10-CM | POA: Insufficient documentation

## 2020-06-20 DIAGNOSIS — I10 Essential (primary) hypertension: Secondary | ICD-10-CM | POA: Diagnosis not present

## 2020-06-20 DIAGNOSIS — Z87891 Personal history of nicotine dependence: Secondary | ICD-10-CM | POA: Insufficient documentation

## 2020-06-20 DIAGNOSIS — E119 Type 2 diabetes mellitus without complications: Secondary | ICD-10-CM | POA: Insufficient documentation

## 2020-06-20 DIAGNOSIS — J45909 Unspecified asthma, uncomplicated: Secondary | ICD-10-CM | POA: Insufficient documentation

## 2020-06-20 DIAGNOSIS — R0789 Other chest pain: Secondary | ICD-10-CM | POA: Insufficient documentation

## 2020-06-20 DIAGNOSIS — R0602 Shortness of breath: Secondary | ICD-10-CM | POA: Diagnosis not present

## 2020-06-20 LAB — BASIC METABOLIC PANEL
Anion gap: 9 (ref 5–15)
BUN: 27 mg/dL — ABNORMAL HIGH (ref 8–23)
CO2: 24 mmol/L (ref 22–32)
Calcium: 9.4 mg/dL (ref 8.9–10.3)
Chloride: 105 mmol/L (ref 98–111)
Creatinine, Ser: 1.59 mg/dL — ABNORMAL HIGH (ref 0.44–1.00)
GFR calc Af Amer: 34 mL/min — ABNORMAL LOW (ref >60)
GFR calc non Af Amer: 29 mL/min — ABNORMAL LOW (ref >60)
Glucose, Bld: 127 mg/dL — ABNORMAL HIGH (ref 70–99)
Potassium: 4.5 mmol/L (ref 3.5–5.1)
Sodium: 138 mmol/L (ref 135–145)

## 2020-06-20 LAB — CBC
HCT: 36.5 % (ref 36.0–46.0)
Hemoglobin: 11.7 g/dL — ABNORMAL LOW (ref 12.0–15.0)
MCH: 29.3 pg (ref 26.0–34.0)
MCHC: 32.1 g/dL (ref 30.0–36.0)
MCV: 91.5 fL (ref 80.0–100.0)
Platelets: 313 10*3/uL (ref 150–400)
RBC: 3.99 MIL/uL (ref 3.87–5.11)
RDW: 13.8 % (ref 11.5–15.5)
WBC: 6.7 10*3/uL (ref 4.0–10.5)
nRBC: 0 % (ref 0.0–0.2)

## 2020-06-20 LAB — TROPONIN I (HIGH SENSITIVITY): Troponin I (High Sensitivity): 6 ng/L (ref <18)

## 2020-06-20 NOTE — ED Triage Notes (Signed)
Pt presents with c/o shortness of breath and chest pain that started yesterday. Pt reports that today, the pain felt like pressure in nature, family hx of heart issues. No distress noted at this time.

## 2020-06-21 ENCOUNTER — Emergency Department (HOSPITAL_COMMUNITY)
Admission: EM | Admit: 2020-06-21 | Discharge: 2020-06-21 | Disposition: A | Discharge status: Home / Self Care | Payer: Medicare Other | Attending: Emergency Medicine | Admitting: Emergency Medicine

## 2020-06-21 ENCOUNTER — Emergency Department (HOSPITAL_COMMUNITY): Payer: Medicare Other

## 2020-06-21 DIAGNOSIS — R0789 Other chest pain: Secondary | ICD-10-CM

## 2020-06-21 DIAGNOSIS — D649 Anemia, unspecified: Secondary | ICD-10-CM

## 2020-06-21 DIAGNOSIS — I10 Essential (primary) hypertension: Secondary | ICD-10-CM

## 2020-06-21 DIAGNOSIS — N289 Disorder of kidney and ureter, unspecified: Secondary | ICD-10-CM

## 2020-06-21 LAB — RESPIRATORY PANEL BY RT PCR (FLU A&B, COVID)
Influenza A by PCR: NEGATIVE
Influenza B by PCR: NEGATIVE
SARS Coronavirus 2 by RT PCR: NEGATIVE

## 2020-06-21 LAB — D-DIMER, QUANTITATIVE: D-Dimer, Quant: 1.22 ug/mL-FEU — ABNORMAL HIGH (ref 0.00–0.50)

## 2020-06-21 LAB — TROPONIN I (HIGH SENSITIVITY): Troponin I (High Sensitivity): 9 ng/L (ref <18)

## 2020-06-21 MED ORDER — ASPIRIN 81 MG PO CHEW
324.0000 mg | CHEWABLE_TABLET | Freq: Once | ORAL | Status: AC
  Administered 2020-06-21: 324 mg via ORAL
  Filled 2020-06-21: qty 4

## 2020-06-21 MED ORDER — TECHNETIUM TO 99M ALBUMIN AGGREGATED
4.2000 | Freq: Once | INTRAVENOUS | Status: AC | PRN
  Administered 2020-06-21: 4.2 via INTRAVENOUS

## 2020-06-21 MED ORDER — HYDROCORTISONE NA SUCCINATE PF 250 MG IJ SOLR: 200.0000 mg | Freq: Once | INTRAMUSCULAR | Status: DC

## 2020-06-21 MED ORDER — DIPHENHYDRAMINE HCL 25 MG PO CAPS: 50.0000 mg | ORAL_CAPSULE | Freq: Once | ORAL | Status: DC

## 2020-06-21 MED ORDER — NITROGLYCERIN 0.4 MG SL SUBL: 0.4000 mg | SUBLINGUAL_TABLET | SUBLINGUAL | Status: DC | PRN

## 2020-06-21 MED ORDER — DIPHENHYDRAMINE HCL 50 MG/ML IJ SOLN: 50.0000 mg | Freq: Once | INTRAMUSCULAR | Status: DC

## 2020-06-21 NOTE — ED Provider Notes (Signed)
Care assumed from Dr. Roxanne Mins at shift change.  Patient presenting here after an episode of chest tightness and near syncope.  Her work-up thus far is unremarkable.  Patient awaiting results of a perfusion scan to rule out pulmonary embolus.  This was performed and was negative.  Patient has been reassessed.  She is symptom-free and vitals are stable.  Oxygen saturations are 100%.  Patient appears appropriate for discharge with as needed return.   Veryl Speak, MD 06/21/20 [phone]

## 2020-06-21 NOTE — ED Notes (Signed)
Pt transported for VQ scan. 

## 2020-06-21 NOTE — Discharge Instructions (Addendum)
Continue medications as previously prescribed.  Follow-up with primary doctor in the next week, and return to the ER if your symptoms significantly worsen or change.

## 2020-06-21 NOTE — ED Provider Notes (Signed)
[facility] DEPT Provider Note   CSN: [id number] Arrival date & time: 06/20/20  1619   History Chief Complaint  Patient presents with  . Shortness of Breath  . Chest Pain    Janet Gilbert is a 86 y.o. female.  The history is provided by the patient.  Shortness of Breath Associated symptoms: chest pain   Chest Pain Associated symptoms: shortness of breath   He has history of hypertension and comes in because of chest discomfort yesterday and today.  Yesterday, she had an episode of the tight feeling underneath her breast with associated lightheadedness.  This lasted about 10 minutes before resolving.  She had recurrence of the same symptoms at about 11 AM, but significantly worse.  Symptoms have been steady throughout the day.  She currently rates her discomfort at 6/10.  Nothing makes it better, nothing makes it worse.  She was in the waiting room and actually thought that she should go home, but when she went to the bathroom and stood up, the dizziness and heavy feeling got worse.  There is no associated nausea, vomiting, diaphoresis.  She has not tried any treatments at home.  She denies dyspnea.  She has never had symptoms like this before.  She is a non-smoker and denies history of diabetes or hyperlipidemia and there is no family history of premature coronary atherosclerosis.  Past Medical History:  Diagnosis Date  . Arthritis   . Asthma    child  . Diabetes mellitus without complication ([place])   . Heart murmur   . History of hiatal hernia   . Hypertension   . Peripheral vascular disease ([place])    left leg  dvt after hysterectomy 47 yrs ago  . Pneumonia    grade school  . Sleep apnea    pos study lost wt and does not use cpap last test 5-6 yrs ago    Patient Active Problem List   Diagnosis Date Noted  . Hyperkalemia 01/02/2017  . 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 ABDOMINAL HYSTERECTOMY  1947  . ELBOW SURGERY Right 90's   nerves  . REVERSE SHOULDER ARTHROPLASTY Right 01/01/2017   Procedure: REVERSE SHOULDER ARTHROPLASTY;  Surgeon: Tania Ade, MD;  Location: [facility];  Service: Orthopedics;  Laterality: Right;  RIGHT REVERSE SHOULDER ARTHROPLASTY  . REVERSE TOTAL SHOULDER ARTHROPLASTY Right 01/01/2017     OB History   No obstetric history on file.     History reviewed. No pertinent family history.  Social History   Tobacco Use  . Smoking status: Former Smoker    Years: 5.00    Types: Cigarettes    Quit date: 12/29/1976    Years since quitting: 43.5  . Smokeless tobacco: Never Used  . Tobacco comment: few a day  Substance Use Topics  . Alcohol use: No  . Drug use: No    Home Medications Prior to Admission medications   Medication Sig Start Date End Date Taking? Authorizing Provider  amLODipine (NORVASC) 5 MG tablet Take 5 mg by mouth daily.    [provider]  aspirin EC 81 MG tablet Take 81 mg by mouth daily.    [provider]  atorvastatin (LIPITOR) 20 MG tablet Take 20 mg by mouth daily.    [provider]  Cholecalciferol (VITAMIN D3) 5000 units CAPS Take 5,000 Units by mouth daily.    [provider]  CINNAMON PO  Take 1,000 mg by mouth 2 (two) times daily.    [provider]  Cyanocobalamin (B-12) 2500 MCG TABS Take 2,500 mcg by mouth daily.    [provider]  docusate sodium (COLACE) 250 MG capsule Take 1 capsule (250 mg total) by mouth daily. 03/23/18   Margarita Mail, PA-C  Histamine Dihydrochloride (AUSTRALIAN DREAM ARTHRITIS) 0.025 % CREA Apply 1 application topically 3 (three) times daily as needed (pain).    [provider]  HYDROcodone-acetaminophen (NORCO) 5-325 MG tablet Take 1-2 tablets by mouth every 6 (six) hours as needed for moderate pain. 03/23/18   Margarita Mail, PA-C  loratadine (CLARITIN) 10 MG tablet Take 10 mg by mouth daily.    [provider]  losartan (COZAAR) 100 MG tablet Take 100 mg by mouth daily.    [provider]  metFORMIN (GLUCOPHAGE) 1000 MG tablet Take 500-1,000 mg by mouth 2 (two) times daily with a meal. Takes 500 mg in the morning and 1000 mg in the evening    [provider]  Multiple Vitamin (MULTIVITAMIN WITH MINERALS) TABS tablet Take 1 tablet by mouth daily. Women's 50+    [provider]  traMADol (ULTRAM) 50 MG tablet Take 1 tablet (50 mg total) by mouth every 6 (six) hours as needed. Patient not taking: Reported on 12/24/2016 07/24/16   Tegeler, Gwenyth Allegra, MD  trolamine salicylate (ASPERCREME) 10 % cream Apply 1 application topically as needed for muscle pain.    [provider]    Allergies    Contrast media [iodinated diagnostic agents], Sulfa antibiotics, Azithromycin, Codeine, and Darvon [propoxyphene]  Review of Systems   Review of Systems  Respiratory: Positive for shortness of breath.   Cardiovascular: Positive for chest pain.  All other systems reviewed and are negative.   Physical Exam Updated Vital Signs BP (!) 169/81 (BP Location: Left Arm)   Pulse 92   Temp 98.3 F (36.8 C) (Oral)   Resp 16   SpO2 98%   Physical Exam Vitals and nursing note reviewed.   86 year old female, resting comfortably and in no acute distress. Vital signs are significant for elevated blood pressure. Oxygen saturation is 98%, which is normal. Head is normocephalic and atraumatic. PERRLA, EOMI. Oropharynx is clear. Neck is nontender and supple without adenopathy or JVD. Back is nontender and there is no CVA tenderness. Lungs are clear without rales, wheezes, or rhonchi. Chest is nontender. Heart has regular rate and rhythm without murmur. Abdomen is soft, flat, nontender without masses or hepatosplenomegaly and peristalsis is normoactive. Extremities have no cyanosis or edema, full range of motion is present. Skin is warm and dry without rash. Neurologic:  Mental status is normal, cranial nerves are intact, there are no motor or sensory deficits.  ED Results / Procedures / Treatments   Labs (all labs ordered are listed, but only abnormal results are displayed) Labs Reviewed  BASIC METABOLIC PANEL - Abnormal; Notable for the following components:      Result Value   Glucose, Bld 127 (*)    BUN 27 (*)    Creatinine, Ser 1.59 (*)    GFR calc non Af Amer 29 (*)    GFR calc Af Amer 34 (*)    All other components within normal limits  CBC - Abnormal; Notable for the following components:   Hemoglobin 11.7 (*)    All other components within normal limits  D-DIMER, QUANTITATIVE (NOT AT [facility]) - Abnormal; Notable for the following components:  D-Dimer, Quant 1.22 (*)    All other components within normal limits  RESPIRATORY PANEL BY RT PCR (FLU A&B, COVID)  TROPONIN I (HIGH SENSITIVITY)  TROPONIN I (HIGH SENSITIVITY)    EKG Normal sinus rhythm 96 bpm with single PVC noted.  Left axis deviation.  Right bundle branch block, left anterior fascicular block.  When compared with ECG 05/12/2017, no significant changes are seen.  Radiology DG Chest 2 View  Result Date: 06/20/2020 CLINICAL DATA:  Chest pain. EXAM: CHEST - 2 VIEW COMPARISON:  December 29, 2016 FINDINGS: The heart size and mediastinal contours are within normal limits. Both lungs are clear. No pleural effusions. No pneumothorax. Reverse right shoulder arthroplasty. Clips project in the right paratracheal region, immediately above the clavicular head. Degenerative changes of the thoracic spine with flowing anterior osteophytes, suggestive of diffuse idiopathic skeletal hyperostosis (DISH). IMPRESSION: No acute cardiopulmonary disease. Electronically Signed   By: Margaretha Sheffield MD   On: 06/20/2020 17:47    Procedures Procedures  Medications Ordered in ED Medications  nitroGLYCERIN (NITROSTAT) SL tablet 0.4 mg (has no administration in time range)  aspirin chewable tablet 324 mg (324 mg  Oral Given 06/21/20 0116)    ED Course  I have reviewed the triage vital signs and the nursing notes.  Pertinent labs & imaging results that were available during my care of the patient were reviewed by me and considered in my medical decision making (see chart for details).  MDM Rules/Calculators/A&P Chest discomfort and dizziness of uncertain cause.  Initial troponin is negative.  ECG has been ordered, I cannot find any tracing so we will get ECG and repeat troponin.  We will also check D-dimer to rule out pulmonary embolism.  Symptoms are somewhat atypical for either of these.  Doubt aortic dissection.  Chest x-ray shows no evidence of pneumonia or cardiomegaly.  Labs do show renal insufficiency and anemia both of which are improved compared with baseline.  He will be given aspirin and will give be given a therapeutic trial of nitroglycerin.  ECG apparently had not crossed over into MUSE, but is unchanged from ECG in 2018.  There was minimal improvement with nitroglycerin.  Repeat troponin showed no significant change.  D-dimer has come back elevated.  However, she is allergic to IV dye, and also has renal insufficiency.  Lung scan is ordered.  Case is signed out to Dr. Stark Jock.  Final Clinical Impression(s) / ED Diagnoses Final diagnoses:  Chest discomfort    Rx / DC Orders ED Discharge Orders    None       Delora Fuel, MD 62/70/35 2239

## 2020-10-10 DIAGNOSIS — N1832 Chronic kidney disease, stage 3b: Secondary | ICD-10-CM | POA: Diagnosis not present

## 2020-10-10 DIAGNOSIS — K219 Gastro-esophageal reflux disease without esophagitis: Secondary | ICD-10-CM | POA: Diagnosis not present

## 2020-10-10 DIAGNOSIS — M8580 Other specified disorders of bone density and structure, unspecified site: Secondary | ICD-10-CM | POA: Diagnosis not present

## 2020-10-10 DIAGNOSIS — E1142 Type 2 diabetes mellitus with diabetic polyneuropathy: Secondary | ICD-10-CM | POA: Diagnosis not present

## 2020-10-10 DIAGNOSIS — E1122 Type 2 diabetes mellitus with diabetic chronic kidney disease: Secondary | ICD-10-CM | POA: Diagnosis not present

## 2020-10-10 DIAGNOSIS — I129 Hypertensive chronic kidney disease with stage 1 through stage 4 chronic kidney disease, or unspecified chronic kidney disease: Secondary | ICD-10-CM | POA: Diagnosis not present

## 2020-10-10 DIAGNOSIS — I10 Essential (primary) hypertension: Secondary | ICD-10-CM | POA: Diagnosis not present

## 2020-10-10 DIAGNOSIS — E7800 Pure hypercholesterolemia, unspecified: Secondary | ICD-10-CM | POA: Diagnosis not present

## 2020-11-01 DIAGNOSIS — E1122 Type 2 diabetes mellitus with diabetic chronic kidney disease: Secondary | ICD-10-CM | POA: Diagnosis not present

## 2020-11-01 DIAGNOSIS — R1013 Epigastric pain: Secondary | ICD-10-CM | POA: Diagnosis not present

## 2020-12-06 DIAGNOSIS — R1013 Epigastric pain: Secondary | ICD-10-CM | POA: Diagnosis not present

## 2020-12-06 DIAGNOSIS — R413 Other amnesia: Secondary | ICD-10-CM | POA: Diagnosis not present

## 2020-12-07 ENCOUNTER — Other Ambulatory Visit: Payer: Self-pay | Admitting: Internal Medicine

## 2020-12-07 DIAGNOSIS — R1013 Epigastric pain: Secondary | ICD-10-CM

## 2020-12-11 DIAGNOSIS — G3184 Mild cognitive impairment, so stated: Secondary | ICD-10-CM | POA: Diagnosis not present

## 2020-12-12 DIAGNOSIS — E1122 Type 2 diabetes mellitus with diabetic chronic kidney disease: Secondary | ICD-10-CM | POA: Diagnosis not present

## 2020-12-12 DIAGNOSIS — E1142 Type 2 diabetes mellitus with diabetic polyneuropathy: Secondary | ICD-10-CM | POA: Diagnosis not present

## 2020-12-12 DIAGNOSIS — I129 Hypertensive chronic kidney disease with stage 1 through stage 4 chronic kidney disease, or unspecified chronic kidney disease: Secondary | ICD-10-CM | POA: Diagnosis not present

## 2020-12-12 DIAGNOSIS — K219 Gastro-esophageal reflux disease without esophagitis: Secondary | ICD-10-CM | POA: Diagnosis not present

## 2020-12-12 DIAGNOSIS — I10 Essential (primary) hypertension: Secondary | ICD-10-CM | POA: Diagnosis not present

## 2020-12-12 DIAGNOSIS — M8580 Other specified disorders of bone density and structure, unspecified site: Secondary | ICD-10-CM | POA: Diagnosis not present

## 2020-12-12 DIAGNOSIS — N1832 Chronic kidney disease, stage 3b: Secondary | ICD-10-CM | POA: Diagnosis not present

## 2020-12-12 DIAGNOSIS — E7800 Pure hypercholesterolemia, unspecified: Secondary | ICD-10-CM | POA: Diagnosis not present

## 2020-12-13 ENCOUNTER — Other Ambulatory Visit: Payer: Self-pay

## 2020-12-13 ENCOUNTER — Ambulatory Visit (HOSPITAL_COMMUNITY)
Admission: RE | Admit: 2020-12-13 | Discharge: 2020-12-13 | Disposition: A | Discharge status: Home / Self Care | Payer: Medicare Other | Source: Ambulatory Visit | Attending: Internal Medicine | Admitting: Internal Medicine

## 2020-12-13 DIAGNOSIS — R1013 Epigastric pain: Secondary | ICD-10-CM | POA: Insufficient documentation

## 2020-12-13 DIAGNOSIS — K8020 Calculus of gallbladder without cholecystitis without obstruction: Secondary | ICD-10-CM | POA: Diagnosis not present

## 2020-12-26 ENCOUNTER — Other Ambulatory Visit: Payer: Medicare Other

## 2021-01-02 DIAGNOSIS — R1013 Epigastric pain: Secondary | ICD-10-CM | POA: Diagnosis not present

## 2021-01-28 DIAGNOSIS — E7800 Pure hypercholesterolemia, unspecified: Secondary | ICD-10-CM | POA: Diagnosis not present

## 2021-01-28 DIAGNOSIS — I129 Hypertensive chronic kidney disease with stage 1 through stage 4 chronic kidney disease, or unspecified chronic kidney disease: Secondary | ICD-10-CM | POA: Diagnosis not present

## 2021-01-28 DIAGNOSIS — E1122 Type 2 diabetes mellitus with diabetic chronic kidney disease: Secondary | ICD-10-CM | POA: Diagnosis not present

## 2021-01-28 DIAGNOSIS — E1142 Type 2 diabetes mellitus with diabetic polyneuropathy: Secondary | ICD-10-CM | POA: Diagnosis not present

## 2021-01-28 DIAGNOSIS — M8580 Other specified disorders of bone density and structure, unspecified site: Secondary | ICD-10-CM | POA: Diagnosis not present

## 2021-01-28 DIAGNOSIS — I10 Essential (primary) hypertension: Secondary | ICD-10-CM | POA: Diagnosis not present

## 2021-01-28 DIAGNOSIS — K219 Gastro-esophageal reflux disease without esophagitis: Secondary | ICD-10-CM | POA: Diagnosis not present

## 2021-04-02 DIAGNOSIS — N1832 Chronic kidney disease, stage 3b: Secondary | ICD-10-CM | POA: Diagnosis not present

## 2021-04-02 DIAGNOSIS — K219 Gastro-esophageal reflux disease without esophagitis: Secondary | ICD-10-CM | POA: Diagnosis not present

## 2021-04-02 DIAGNOSIS — E1122 Type 2 diabetes mellitus with diabetic chronic kidney disease: Secondary | ICD-10-CM | POA: Diagnosis not present

## 2021-04-02 DIAGNOSIS — E7800 Pure hypercholesterolemia, unspecified: Secondary | ICD-10-CM | POA: Diagnosis not present

## 2021-04-02 DIAGNOSIS — I10 Essential (primary) hypertension: Secondary | ICD-10-CM | POA: Diagnosis not present

## 2021-04-02 DIAGNOSIS — M8580 Other specified disorders of bone density and structure, unspecified site: Secondary | ICD-10-CM | POA: Diagnosis not present

## 2021-04-02 DIAGNOSIS — I129 Hypertensive chronic kidney disease with stage 1 through stage 4 chronic kidney disease, or unspecified chronic kidney disease: Secondary | ICD-10-CM | POA: Diagnosis not present

## 2021-04-02 DIAGNOSIS — E1142 Type 2 diabetes mellitus with diabetic polyneuropathy: Secondary | ICD-10-CM | POA: Diagnosis not present

## 2021-04-17 DIAGNOSIS — E1142 Type 2 diabetes mellitus with diabetic polyneuropathy: Secondary | ICD-10-CM | POA: Diagnosis not present

## 2021-04-17 DIAGNOSIS — G3184 Mild cognitive impairment, so stated: Secondary | ICD-10-CM | POA: Diagnosis not present

## 2021-04-17 DIAGNOSIS — I700 Atherosclerosis of aorta: Secondary | ICD-10-CM | POA: Diagnosis not present

## 2021-04-17 DIAGNOSIS — E1122 Type 2 diabetes mellitus with diabetic chronic kidney disease: Secondary | ICD-10-CM | POA: Diagnosis not present

## 2021-04-17 DIAGNOSIS — Z0000 Encounter for general adult medical examination without abnormal findings: Secondary | ICD-10-CM | POA: Diagnosis not present

## 2021-04-17 DIAGNOSIS — K219 Gastro-esophageal reflux disease without esophagitis: Secondary | ICD-10-CM | POA: Diagnosis not present

## 2021-04-17 DIAGNOSIS — R1013 Epigastric pain: Secondary | ICD-10-CM | POA: Diagnosis not present

## 2021-04-17 DIAGNOSIS — N1832 Chronic kidney disease, stage 3b: Secondary | ICD-10-CM | POA: Diagnosis not present

## 2021-04-17 DIAGNOSIS — R634 Abnormal weight loss: Secondary | ICD-10-CM | POA: Diagnosis not present

## 2021-04-17 DIAGNOSIS — E7800 Pure hypercholesterolemia, unspecified: Secondary | ICD-10-CM | POA: Diagnosis not present

## 2021-04-17 DIAGNOSIS — Z1389 Encounter for screening for other disorder: Secondary | ICD-10-CM | POA: Diagnosis not present

## 2021-04-17 DIAGNOSIS — I129 Hypertensive chronic kidney disease with stage 1 through stage 4 chronic kidney disease, or unspecified chronic kidney disease: Secondary | ICD-10-CM | POA: Diagnosis not present

## 2021-04-17 DIAGNOSIS — N289 Disorder of kidney and ureter, unspecified: Secondary | ICD-10-CM | POA: Diagnosis not present

## 2021-04-26 DIAGNOSIS — N289 Disorder of kidney and ureter, unspecified: Secondary | ICD-10-CM | POA: Diagnosis not present

## 2021-06-19 DIAGNOSIS — E1142 Type 2 diabetes mellitus with diabetic polyneuropathy: Secondary | ICD-10-CM | POA: Diagnosis not present

## 2021-06-19 DIAGNOSIS — E7800 Pure hypercholesterolemia, unspecified: Secondary | ICD-10-CM | POA: Diagnosis not present

## 2021-06-19 DIAGNOSIS — N1832 Chronic kidney disease, stage 3b: Secondary | ICD-10-CM | POA: Diagnosis not present

## 2021-06-19 DIAGNOSIS — M8580 Other specified disorders of bone density and structure, unspecified site: Secondary | ICD-10-CM | POA: Diagnosis not present

## 2021-06-19 DIAGNOSIS — E1122 Type 2 diabetes mellitus with diabetic chronic kidney disease: Secondary | ICD-10-CM | POA: Diagnosis not present

## 2021-06-19 DIAGNOSIS — I10 Essential (primary) hypertension: Secondary | ICD-10-CM | POA: Diagnosis not present

## 2021-06-19 DIAGNOSIS — I129 Hypertensive chronic kidney disease with stage 1 through stage 4 chronic kidney disease, or unspecified chronic kidney disease: Secondary | ICD-10-CM | POA: Diagnosis not present

## 2021-06-19 DIAGNOSIS — K219 Gastro-esophageal reflux disease without esophagitis: Secondary | ICD-10-CM | POA: Diagnosis not present

## 2021-08-20 DIAGNOSIS — N1832 Chronic kidney disease, stage 3b: Secondary | ICD-10-CM | POA: Diagnosis not present

## 2021-08-20 DIAGNOSIS — E7800 Pure hypercholesterolemia, unspecified: Secondary | ICD-10-CM | POA: Diagnosis not present

## 2021-08-20 DIAGNOSIS — M8580 Other specified disorders of bone density and structure, unspecified site: Secondary | ICD-10-CM | POA: Diagnosis not present

## 2021-08-20 DIAGNOSIS — I10 Essential (primary) hypertension: Secondary | ICD-10-CM | POA: Diagnosis not present

## 2021-08-20 DIAGNOSIS — E1142 Type 2 diabetes mellitus with diabetic polyneuropathy: Secondary | ICD-10-CM | POA: Diagnosis not present

## 2021-08-20 DIAGNOSIS — K219 Gastro-esophageal reflux disease without esophagitis: Secondary | ICD-10-CM | POA: Diagnosis not present

## 2021-08-20 DIAGNOSIS — I129 Hypertensive chronic kidney disease with stage 1 through stage 4 chronic kidney disease, or unspecified chronic kidney disease: Secondary | ICD-10-CM | POA: Diagnosis not present

## 2021-08-20 DIAGNOSIS — E1122 Type 2 diabetes mellitus with diabetic chronic kidney disease: Secondary | ICD-10-CM | POA: Diagnosis not present

## 2021-08-21 DIAGNOSIS — E1122 Type 2 diabetes mellitus with diabetic chronic kidney disease: Secondary | ICD-10-CM | POA: Diagnosis not present

## 2021-08-21 DIAGNOSIS — Z23 Encounter for immunization: Secondary | ICD-10-CM | POA: Diagnosis not present

## 2021-08-21 DIAGNOSIS — G3184 Mild cognitive impairment, so stated: Secondary | ICD-10-CM | POA: Diagnosis not present

## 2021-08-21 DIAGNOSIS — I129 Hypertensive chronic kidney disease with stage 1 through stage 4 chronic kidney disease, or unspecified chronic kidney disease: Secondary | ICD-10-CM | POA: Diagnosis not present

## 2021-08-21 DIAGNOSIS — K219 Gastro-esophageal reflux disease without esophagitis: Secondary | ICD-10-CM | POA: Diagnosis not present

## 2021-09-07 DIAGNOSIS — E119 Type 2 diabetes mellitus without complications: Secondary | ICD-10-CM | POA: Diagnosis not present

## 2021-12-02 DIAGNOSIS — I10 Essential (primary) hypertension: Secondary | ICD-10-CM | POA: Diagnosis not present

## 2021-12-02 DIAGNOSIS — E1122 Type 2 diabetes mellitus with diabetic chronic kidney disease: Secondary | ICD-10-CM | POA: Diagnosis not present

## 2021-12-02 DIAGNOSIS — I129 Hypertensive chronic kidney disease with stage 1 through stage 4 chronic kidney disease, or unspecified chronic kidney disease: Secondary | ICD-10-CM | POA: Diagnosis not present

## 2021-12-02 DIAGNOSIS — N1832 Chronic kidney disease, stage 3b: Secondary | ICD-10-CM | POA: Diagnosis not present

## 2021-12-04 DIAGNOSIS — I700 Atherosclerosis of aorta: Secondary | ICD-10-CM | POA: Diagnosis not present

## 2021-12-04 DIAGNOSIS — E1122 Type 2 diabetes mellitus with diabetic chronic kidney disease: Secondary | ICD-10-CM | POA: Diagnosis not present

## 2021-12-04 DIAGNOSIS — G3184 Mild cognitive impairment, so stated: Secondary | ICD-10-CM | POA: Diagnosis not present

## 2021-12-04 DIAGNOSIS — N1832 Chronic kidney disease, stage 3b: Secondary | ICD-10-CM | POA: Diagnosis not present

## 2021-12-04 DIAGNOSIS — E1142 Type 2 diabetes mellitus with diabetic polyneuropathy: Secondary | ICD-10-CM | POA: Diagnosis not present

## 2021-12-04 DIAGNOSIS — I129 Hypertensive chronic kidney disease with stage 1 through stage 4 chronic kidney disease, or unspecified chronic kidney disease: Secondary | ICD-10-CM | POA: Diagnosis not present

## 2021-12-04 DIAGNOSIS — K219 Gastro-esophageal reflux disease without esophagitis: Secondary | ICD-10-CM | POA: Diagnosis not present

## 2022-03-19 IMAGING — CR DG CHEST 2V
2 series · 2 of 2 positions shown · non-contrast
Comparison: December 29, 2016

CLINICAL DATA: Chest pain.

EXAM:
CHEST - 2 VIEW

[w chest pa]
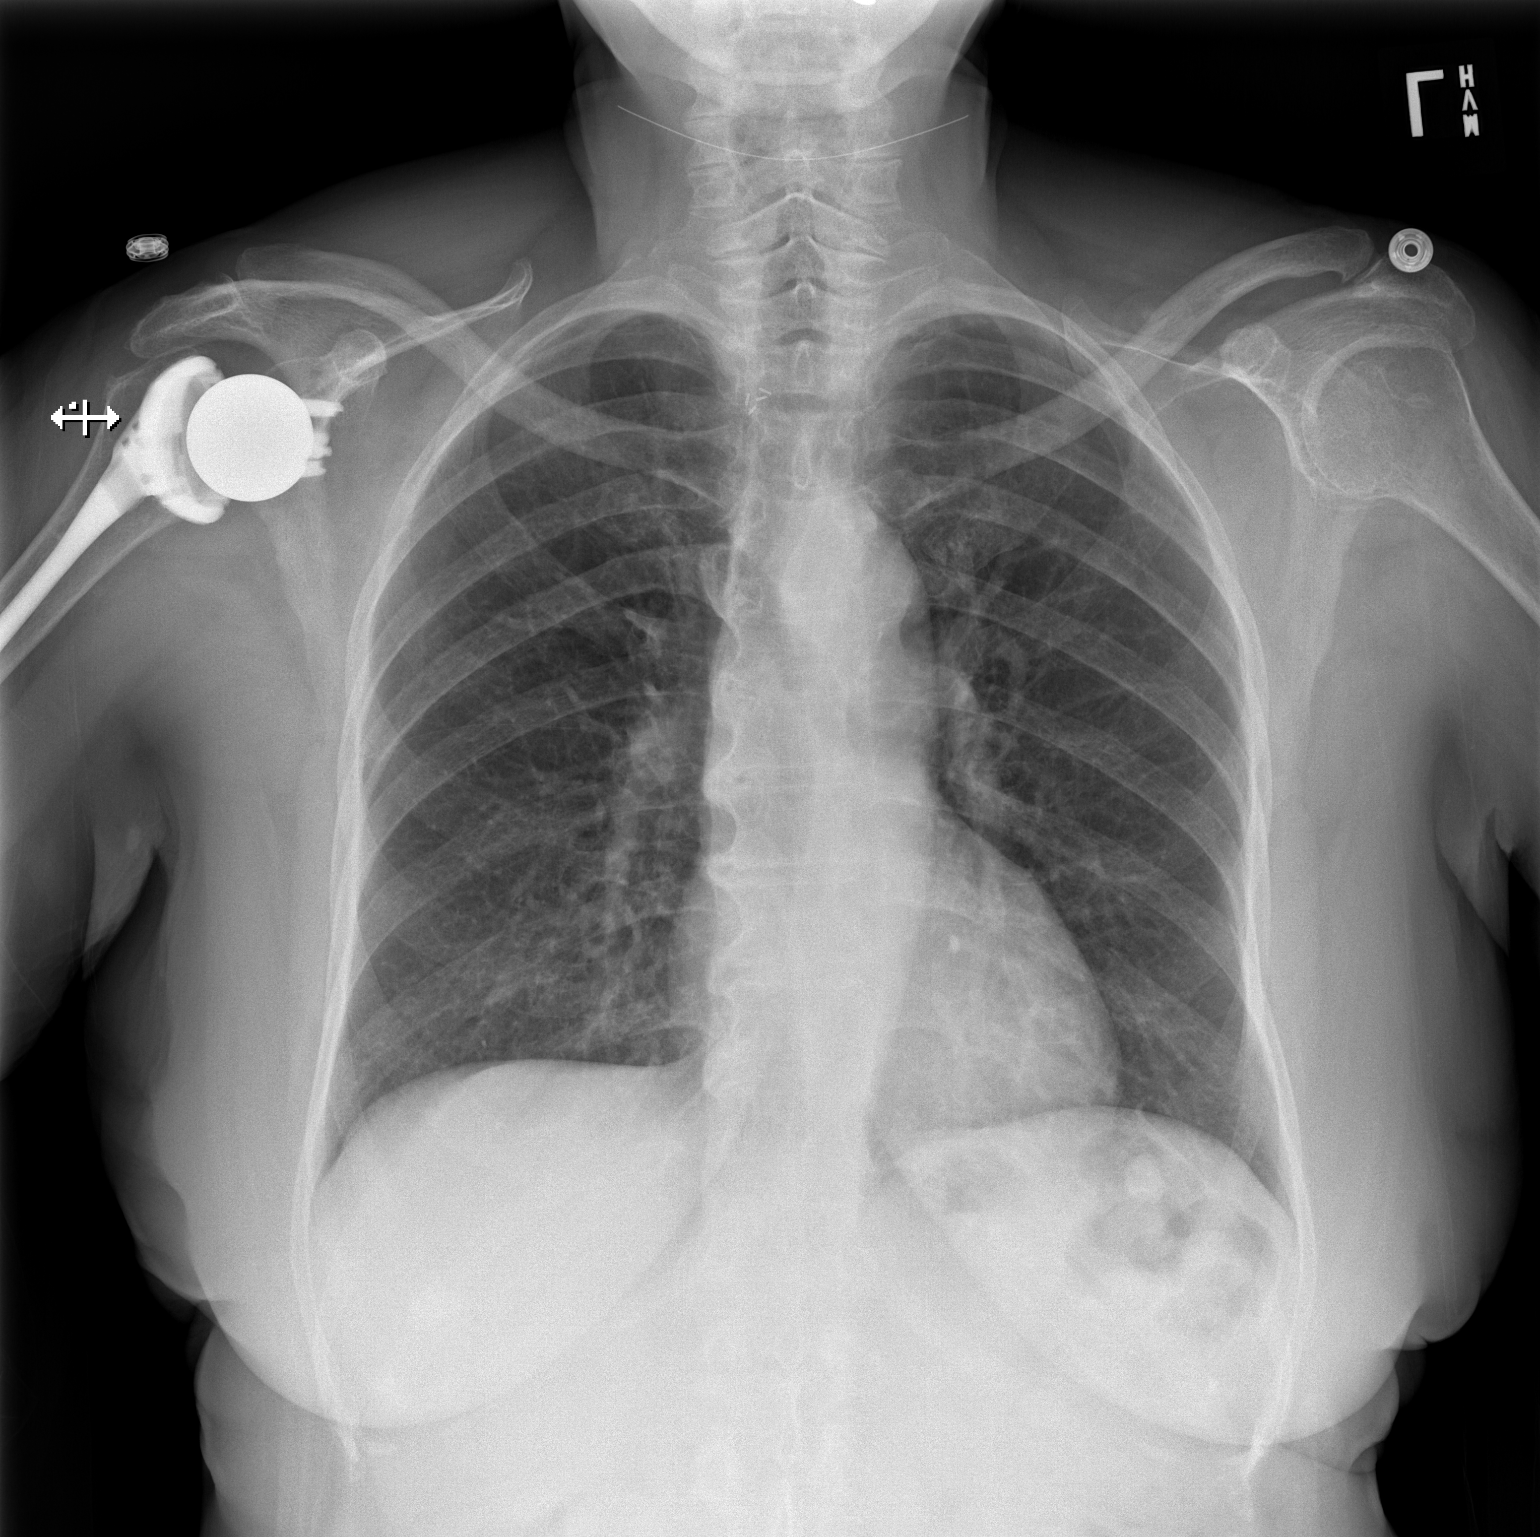

[w chest lat]
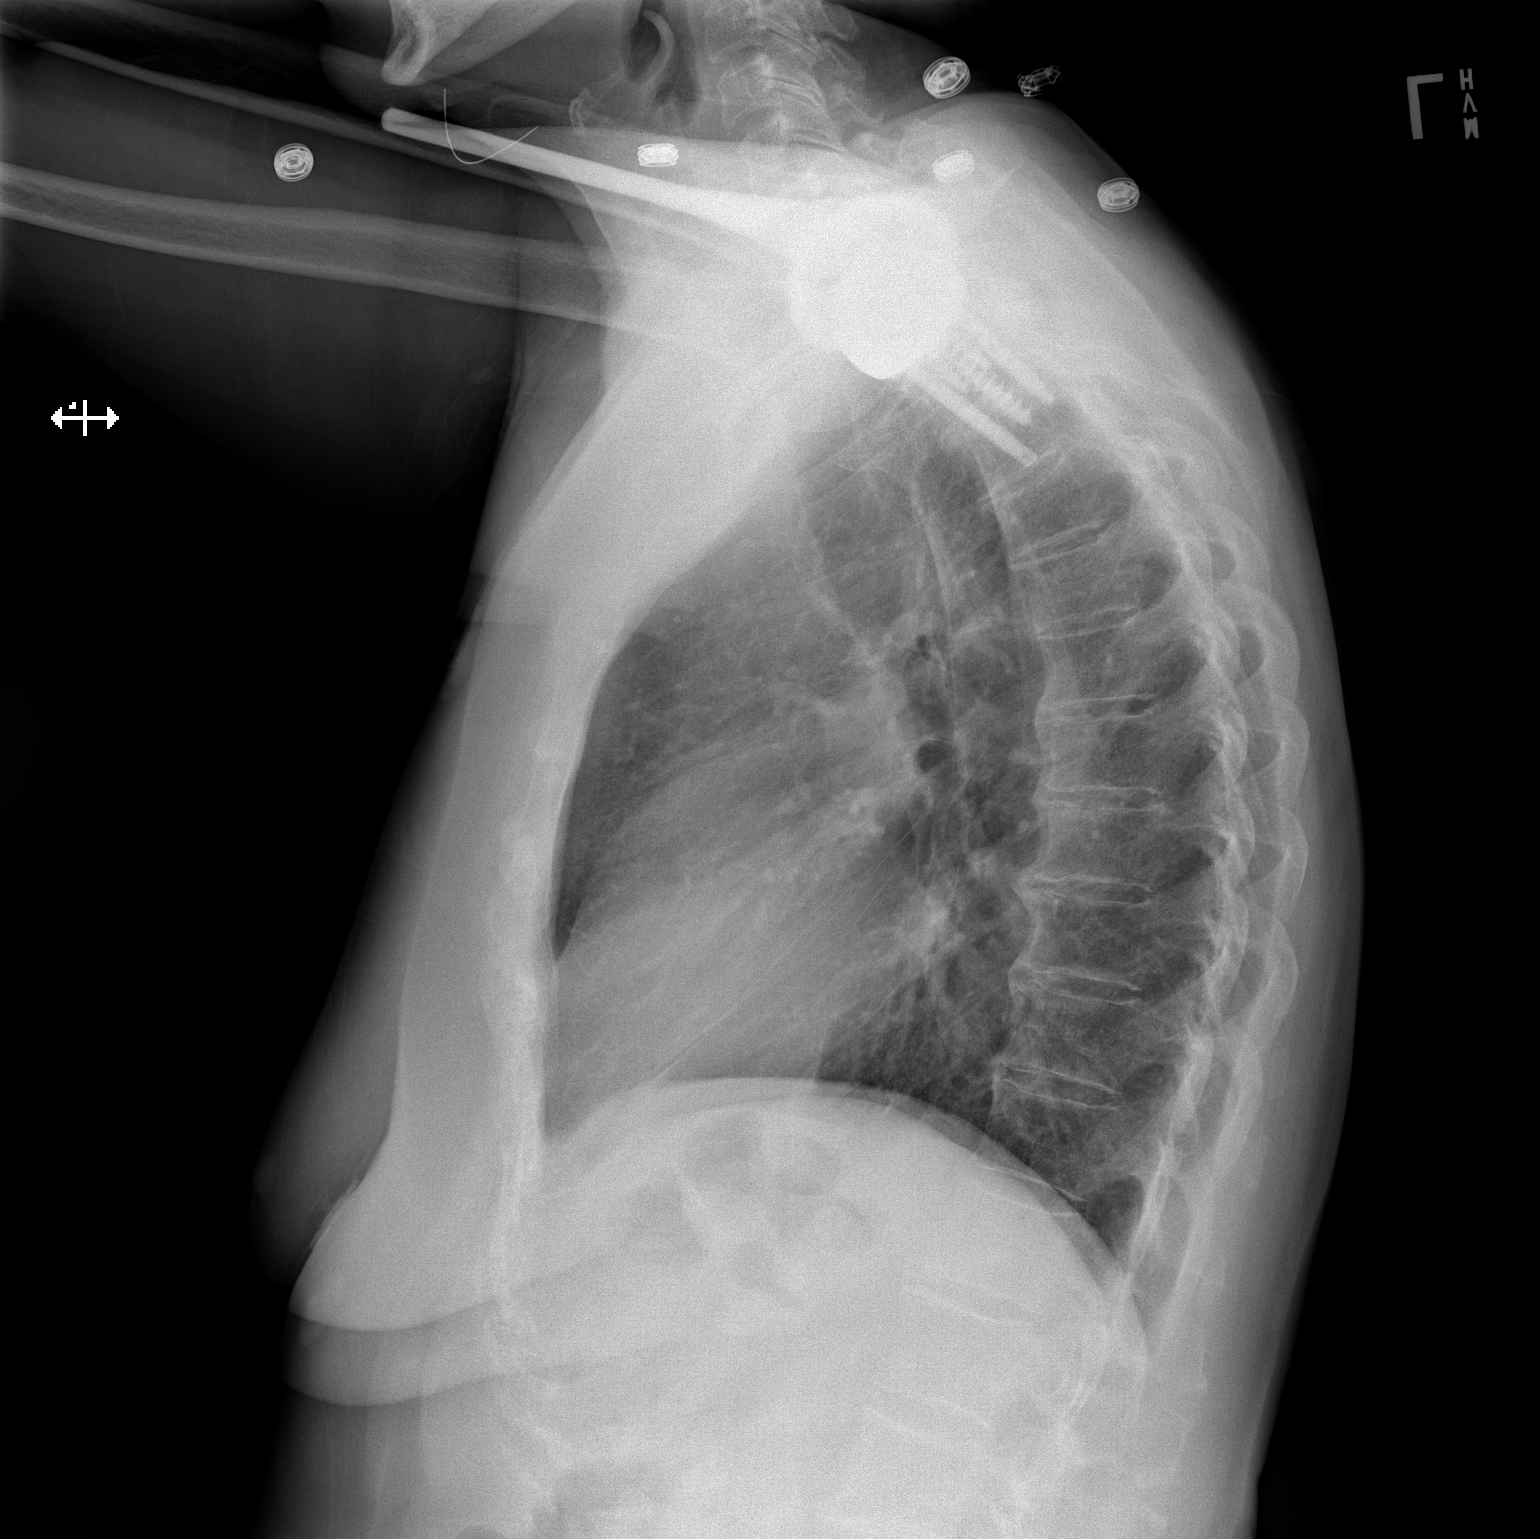

[2 of 2 positions shown; findings below may reference images not displayed]

FINDINGS: The heart size and mediastinal contours are within normal limits.
Both lungs are clear. No pleural effusions. No pneumothorax. Reverse
right shoulder arthroplasty. Clips project in the right paratracheal
region, immediately above the clavicular head. Degenerative changes
of the thoracic spine with flowing anterior osteophytes, suggestive
of diffuse idiopathic skeletal hyperostosis (DISH).
IMPRESSION: No acute cardiopulmonary disease.

## 2022-05-02 DIAGNOSIS — E1122 Type 2 diabetes mellitus with diabetic chronic kidney disease: Secondary | ICD-10-CM | POA: Diagnosis not present

## 2022-05-02 DIAGNOSIS — I129 Hypertensive chronic kidney disease with stage 1 through stage 4 chronic kidney disease, or unspecified chronic kidney disease: Secondary | ICD-10-CM | POA: Diagnosis not present

## 2022-05-02 DIAGNOSIS — I700 Atherosclerosis of aorta: Secondary | ICD-10-CM | POA: Diagnosis not present

## 2022-05-02 DIAGNOSIS — M8580 Other specified disorders of bone density and structure, unspecified site: Secondary | ICD-10-CM | POA: Diagnosis not present

## 2022-05-02 DIAGNOSIS — D649 Anemia, unspecified: Secondary | ICD-10-CM | POA: Diagnosis not present

## 2022-05-02 DIAGNOSIS — E1142 Type 2 diabetes mellitus with diabetic polyneuropathy: Secondary | ICD-10-CM | POA: Diagnosis not present

## 2022-05-02 DIAGNOSIS — R413 Other amnesia: Secondary | ICD-10-CM | POA: Diagnosis not present

## 2022-05-02 DIAGNOSIS — I10 Essential (primary) hypertension: Secondary | ICD-10-CM | POA: Diagnosis not present

## 2022-05-09 DIAGNOSIS — R413 Other amnesia: Secondary | ICD-10-CM | POA: Diagnosis not present

## 2022-05-09 DIAGNOSIS — E1142 Type 2 diabetes mellitus with diabetic polyneuropathy: Secondary | ICD-10-CM | POA: Diagnosis not present

## 2022-05-09 DIAGNOSIS — E1122 Type 2 diabetes mellitus with diabetic chronic kidney disease: Secondary | ICD-10-CM | POA: Diagnosis not present

## 2022-05-09 DIAGNOSIS — I129 Hypertensive chronic kidney disease with stage 1 through stage 4 chronic kidney disease, or unspecified chronic kidney disease: Secondary | ICD-10-CM | POA: Diagnosis not present

## 2022-05-09 DIAGNOSIS — E875 Hyperkalemia: Secondary | ICD-10-CM | POA: Diagnosis not present

## 2022-06-04 DIAGNOSIS — M85851 Other specified disorders of bone density and structure, right thigh: Secondary | ICD-10-CM | POA: Diagnosis not present

## 2022-06-04 DIAGNOSIS — Z780 Asymptomatic menopausal state: Secondary | ICD-10-CM | POA: Diagnosis not present

## 2022-06-04 DIAGNOSIS — M85852 Other specified disorders of bone density and structure, left thigh: Secondary | ICD-10-CM | POA: Diagnosis not present

## 2022-07-01 DIAGNOSIS — R7989 Other specified abnormal findings of blood chemistry: Secondary | ICD-10-CM | POA: Diagnosis not present

## 2022-07-01 DIAGNOSIS — R944 Abnormal results of kidney function studies: Secondary | ICD-10-CM | POA: Diagnosis not present

## 2022-09-11 IMAGING — US US ABDOMEN LIMITED RUQ/ASCITES
1 series · 14 of 25 positions shown · non-contrast
Comparison: CT abdomen and pelvis 12/30/2019

CLINICAL DATA: Epigastric pain for 1 month, history diabetes
mellitus, hypertension, former smoker

EXAM:
ULTRASOUND ABDOMEN LIMITED RIGHT UPPER QUADRANT

[Series 1: us abdomen limited ruq (liver/gb) · 14 of 42 slices shown]
[im 1/42]
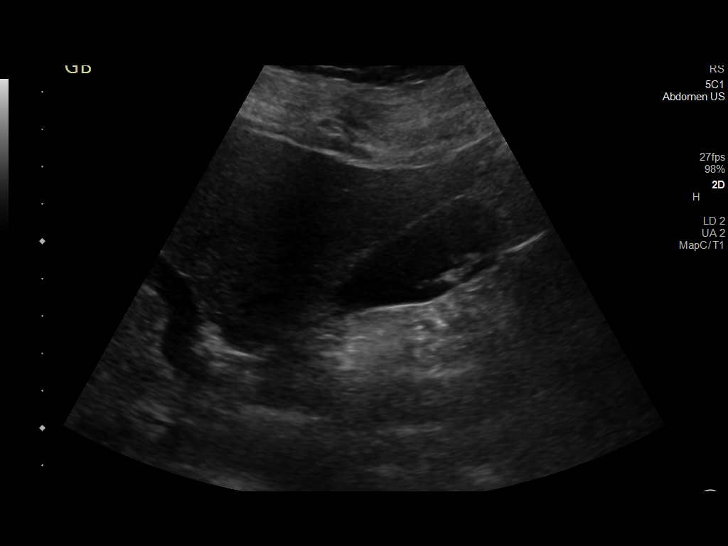
[im 4/42]
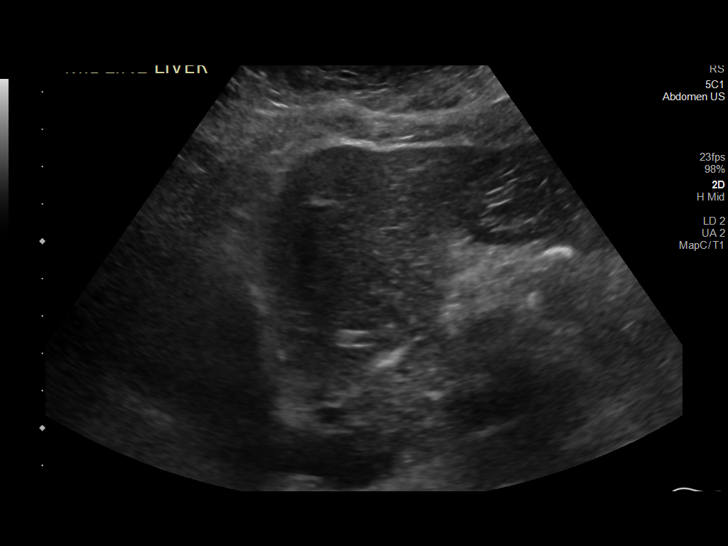
[im 7/42]
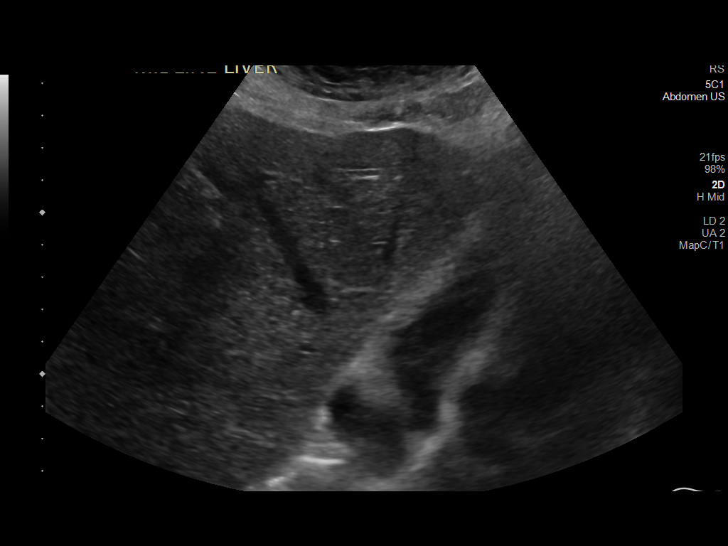
[im 11/42]
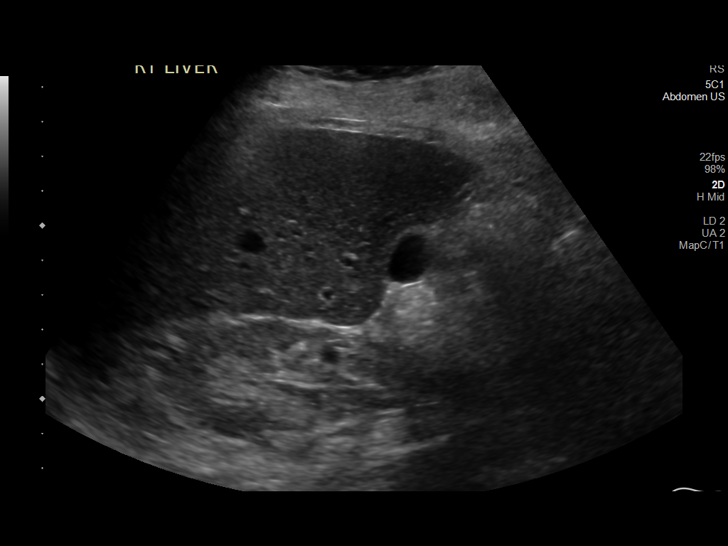
[im 14/42]
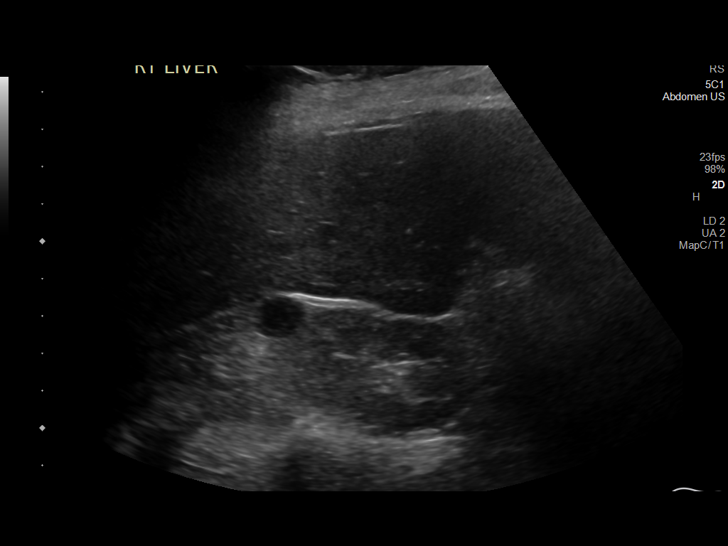
[im 16/42]
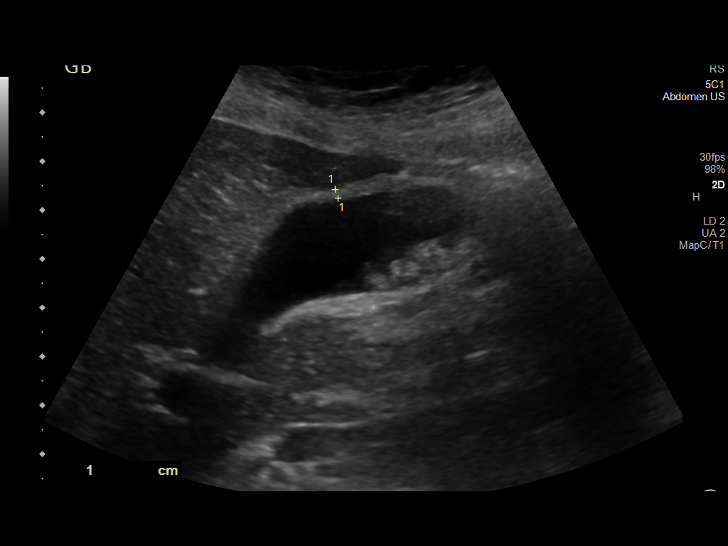
[im 19/42]
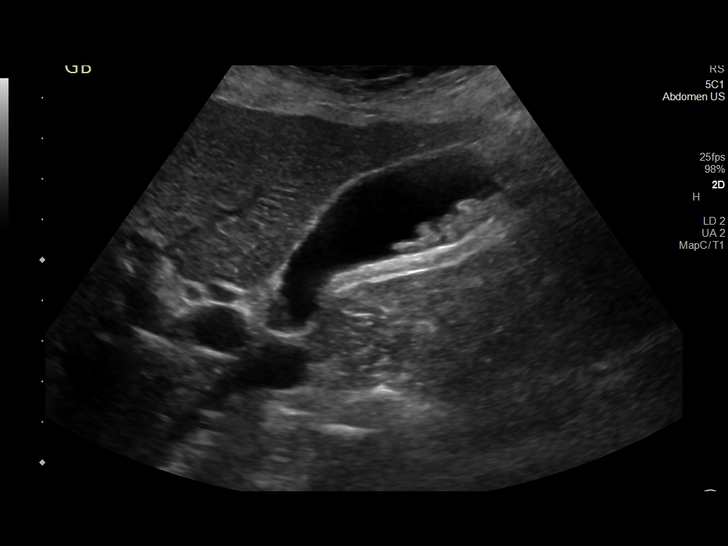
[im 23/42]
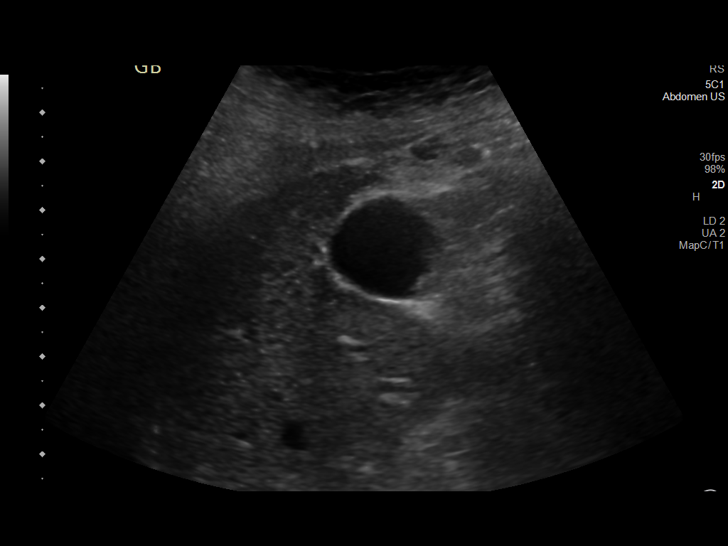
[im 26/42]
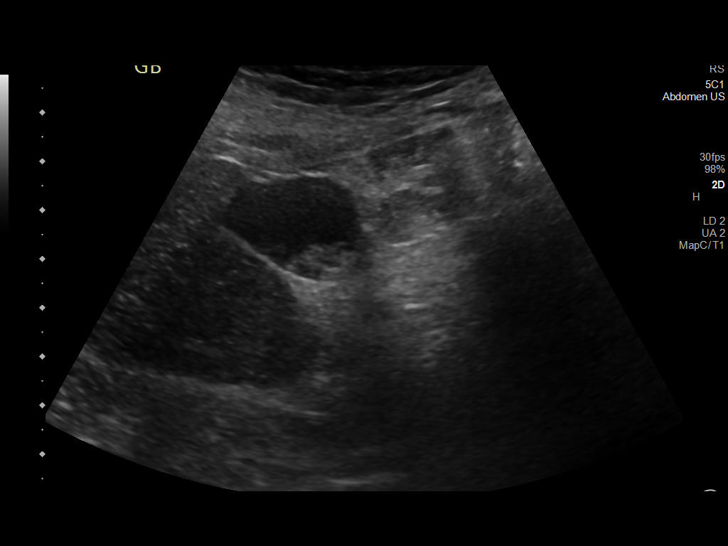
[im 28/42]
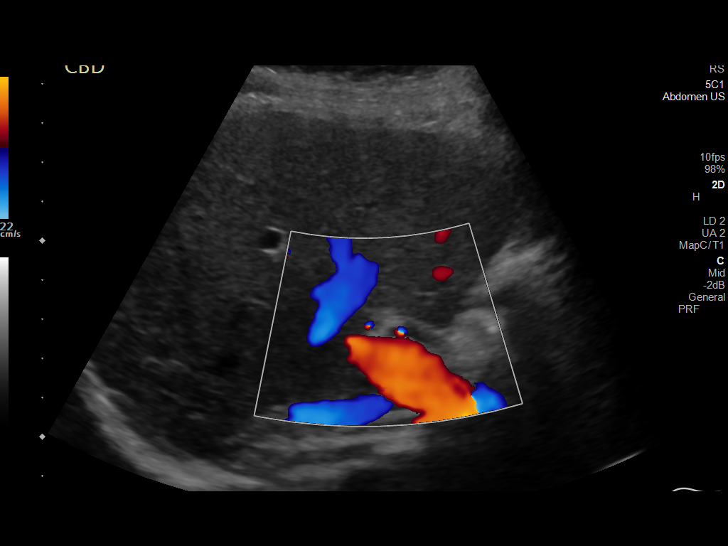
[im 31/42]
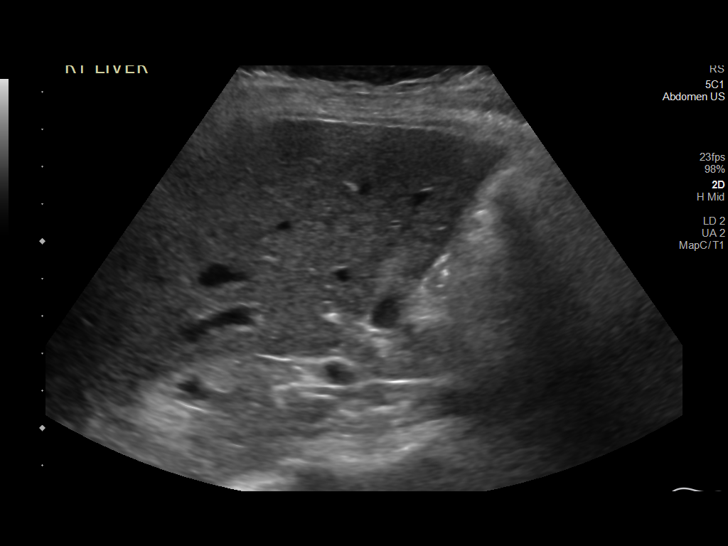
[im 35/42]
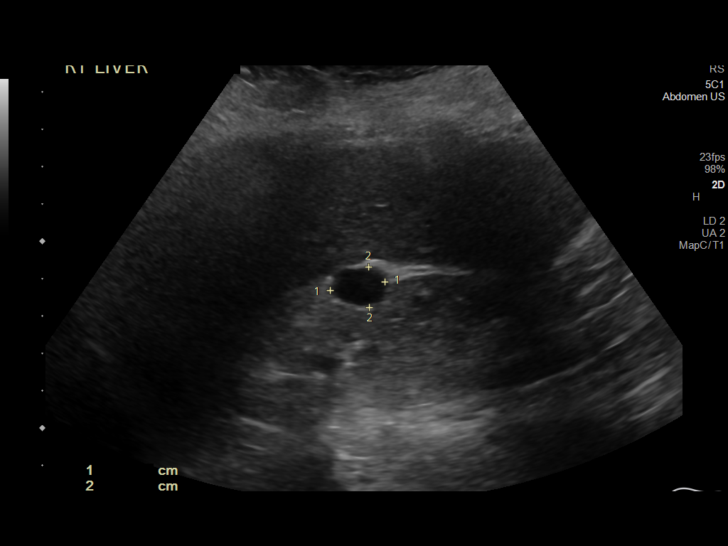
[im 38/42]
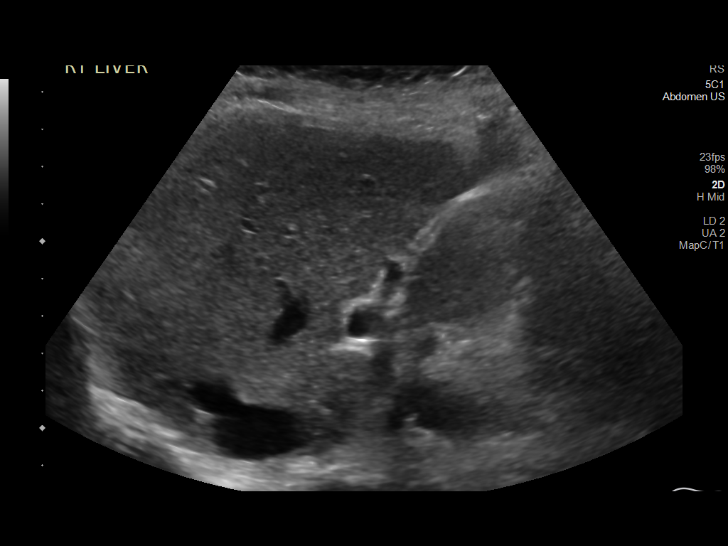
[im 42/42]
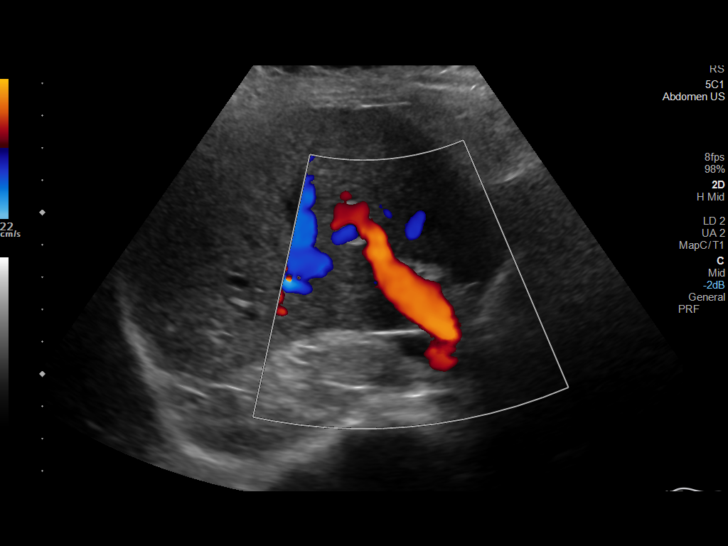

[14 of 25 positions shown; findings below may reference images not displayed]

FINDINGS: Gallbladder:

Multiple rounded minimally shadowing echogenic foci within
gallbladder, corresponding to tiny calcified gallstones on prior CT.
Largest calculus measures 7 mm diameter. No gallbladder wall
thickening, pericholecystic fluid or sonographic Murphy sign.

Common bile duct:

Diameter: 4 mm, normal

Liver:

Slightly heterogeneous hepatic parenchymal echogenicity. No discrete
mass or intrahepatic biliary dilatation. Portal vein is patent on
color Doppler imaging with normal direction of blood flow towards
the liver.

Other: No RIGHT upper quadrant free fluid. Incidentally noted tiny
cyst at upper pole RIGHT kidney 15 mm diameter, also noted on prior
CT.
IMPRESSION: Cholelithiasis without evidence of acute cholecystitis or biliary
dilatation.

15 mm RIGHT upper pole renal cyst.

## 2022-09-12 DIAGNOSIS — M799 Soft tissue disorder, unspecified: Secondary | ICD-10-CM | POA: Diagnosis not present

## 2022-09-23 ENCOUNTER — Ambulatory Visit: Payer: Medicare Other | Admitting: Podiatry

## 2022-09-23 ENCOUNTER — Ambulatory Visit (INDEPENDENT_AMBULATORY_CARE_PROVIDER_SITE_OTHER): Payer: Medicare Other

## 2022-09-23 ENCOUNTER — Encounter: Payer: Self-pay | Admitting: Podiatry

## 2022-09-23 VITALS — BP 166/77 | HR 81

## 2022-09-23 DIAGNOSIS — L97511 Non-pressure chronic ulcer of other part of right foot limited to breakdown of skin: Secondary | ICD-10-CM | POA: Diagnosis not present

## 2022-09-23 NOTE — Patient Instructions (Signed)
Pre-Operative Instructions  Congratulations, you have decided to take an important step to improving your quality of life.  You can be assured that the doctors of [facility] will be with you every step of the way.  Plan to be at the surgery center/hospital at least 1 (one) hour prior to your scheduled time unless otherwise directed by the surgical center/hospital staff.  You must have a responsible adult accompany you, remain during the surgery and drive you home.  Make sure you have directions to the surgical center/hospital and know how to get there on time. For hospital based surgery you will need to obtain a history and physical form from your family physician within 1 month prior to the date of surgery- we will give you a form for you primary physician.  We make every effort to accommodate the date you request for surgery.  There are however, times where surgery dates or times have to be moved.  We will contact you as soon as possible if a change in schedule is required.   No Aspirin/Ibuprofen for one week before surgery.  If you are on aspirin, any non-steroidal anti-inflammatory medications (Mobic, Aleve, Ibuprofen) you should stop taking it 7 days prior to your surgery.  You make take Tylenol  For pain prior to surgery.  Medications- If you are taking daily heart and blood pressure medications, seizure, reflux, allergy, asthma, anxiety, pain or diabetes medications, make sure the surgery center/hospital is aware before the day of surgery so they may notify you which medications to take or avoid the day of surgery. No food or drink after midnight the night before surgery unless directed otherwise by surgical center/hospital staff. No alcoholic beverages 24 hours prior to surgery.  No smoking 24 hours prior to or 24 hours after surgery. Wear loose pants or shorts- loose enough to fit over bandages, boots, and casts. No slip on shoes, sneakers are best. Bring your boot with you to the  surgery center/hospital.  Also bring crutches or a walker if your physician has prescribed it for you.  If you do not have this equipment, it will be provided for you after surgery. If you have not been contracted by the surgery center/hospital by the day before your surgery, call to confirm the date and time of your surgery. Leave-time from work may vary depending on the type of surgery you have.  Appropriate arrangements should be made prior to surgery with your employer. Prescriptions will be provided immediately following surgery by your doctor.  Have these filled as soon as possible after surgery and take the medication as directed. Remove nail polish on the operative foot. Wash the night before surgery.  The night before surgery wash the foot and leg well with the antibacterial soap provided and water paying special attention to beneath the toenails and in between the toes.  Rinse thoroughly with water and dry well with a towel.  Perform this wash unless told not to do so by your physician.  Enclosed: 1 Ice pack (please put in freezer the night before surgery)   1 Hibiclens skin cleaner   Pre-op Instructions  If you have any questions regarding the instructions, do not hesitate to call our office at any point during this process.   [place]: [address] [place], [place] [address] [phone]  [place]: [address] [place],  [address] [phone]  Dr. Layana Konkel, DPM  

## 2022-09-23 NOTE — Progress Notes (Signed)
Subjective:   Patient ID: Janet Gilbert, female   DOB: 89 y.o.   MRN: [id number]   HPI Chief Complaint  Patient presents with   Foot Pain    Arch right - lesion x few months, not sure, drains sometimes, has bled before, complains sometimes that shoe is rubbing foot, no injury or stepping on anything that she remembers   Diabetes    Last A1c unknown   New Patient (Initial Visit)   89 year old female presents the office today with her daughter on the phone for concerns of a skin lesion in the arch of the right foot which been ongoing for several months.  She gets drainage at times and has bled before.  Apparently that was the issue but the wound has stayed about the same has not changed.  166- FBG 07/01/2022  Review of Systems  All other systems reviewed and are negative.  Past Medical History:  Diagnosis Date   Arthritis    Asthma    child   Diabetes mellitus without complication ([place])    Heart murmur    History of hiatal hernia    Hypertension    Peripheral vascular disease ([place])    left leg  dvt after hysterectomy 47 yrs ago   Pneumonia    grade school   Sleep apnea    pos study lost wt and does not use cpap last test 5-6 yrs ago    Past Surgical History:  Procedure Laterality Date   ABDOMINAL HYSTERECTOMY  1947   ELBOW SURGERY Right 90's   nerves   REVERSE SHOULDER ARTHROPLASTY Right 01/01/2017   Procedure: REVERSE SHOULDER ARTHROPLASTY;  Surgeon: Tania Ade, MD;  Location: [facility];  Service: Orthopedics;  Laterality: Right;  RIGHT REVERSE SHOULDER ARTHROPLASTY   REVERSE TOTAL SHOULDER ARTHROPLASTY Right 01/01/2017     Current Outpatient Medications:    amLODipine (NORVASC) 5 MG tablet, Take 5 mg by mouth daily., Disp: , Rfl:    atorvastatin (LIPITOR) 20 MG tablet, Take 20 mg by mouth daily., Disp: , Rfl:    metFORMIN (GLUCOPHAGE) 1000 MG tablet, Take 500-1,000 mg by mouth 2 (two) times daily with a meal. Takes 1000 mg in the morning and '500mg'$  in the evening,  Disp: , Rfl:    Multiple Vitamin (MULTIVITAMIN WITH MINERALS) TABS tablet, Take 1 tablet by mouth daily. Women's 50+, Disp: , Rfl:    trolamine salicylate (ASPERCREME) 10 % cream, Apply 1 application topically as needed for muscle pain., Disp: , Rfl:   Allergies  Allergen Reactions   Contrast Media [Iodinated Contrast Media] Hives and Shortness Of Breath    Hypaque 60%   Sulfa Antibiotics Hives and Shortness Of Breath   Azithromycin Rash   Codeine Nausea Only   Darvon [Propoxyphene] Nausea Only          Objective:  Physical Exam  General: NAD  Dermatological: On the arch of the right foot there is a one by one superficial granular wound present.  There is no surrounding erythema, ascending cellulitis there is no drainage or pus.  There is no fluctuance or crepitation but there is no malodor.  Vascular: Dorsalis Pedis artery and Posterior Tibial artery pedal pulses are palpable bilateral with immedate capillary fill time. There is no pain with calf compression, swelling, warmth, erythema.   Neruologic: Sensation decreased with SWMF.   Musculoskeletal: No significant pain on exam.      Assessment:   Ulceration right foot     Plan:  -Treatment options discussed  including all alternatives, risks, and complications -Etiology of symptoms were discussed -X-rays were obtained and reviewed with the patient.  3 views of the right foot were obtained.  He denies any acute fracture or foreign body noted.  No definitive evidence of osteomyelitis. -Given the nature of the wound has not changed it has been there for several months I recommended biopsy of this.  Discussed with the office versus the abdomen under sedation and her daughter will do this under sedation.  Upon doing this within the next week the upper biopsy performed.  The patient's daughter was on the phone we discussed the surgery was postoperative course as well as all alternatives risks and complications.  I made it clear  that this is not going to close the wound but did biopsy it so we can better treat this.  We discussed risks of surgery including infection, need for further surgery, delayed or nonhealing, blood clots as well as general risk of surgery.  Trula Slade DPM     -get A1c from PCP [facility]

## 2022-09-25 ENCOUNTER — Telehealth: Payer: Self-pay | Admitting: Podiatry

## 2022-09-25 NOTE — Telephone Encounter (Signed)
DOS: 10/08/2022  [employer] Medicare Effective 09/22/2022  Biopsy of Skin Lesion Foot Rt (11104)  Deductible: $0 Out-of-Pocket: $3,800 with $0 met CoInsurance: 0%  Notification or Prior Authorization is not required for the requested services  This [employer] Medicare Advantage members plan does not currently require a prior authorization for these services.   Decision ID #:[id number]  The number above acknowledges your inquiry and our response. Please write this number down and refer to it for future inquiries. Coverage and payment for an item or service is governed by the member's benefit plan document, and, if applicable, the provider's participation agreement with the Health Plan.

## 2022-10-07 DIAGNOSIS — E119 Type 2 diabetes mellitus without complications: Secondary | ICD-10-CM | POA: Diagnosis not present

## 2022-10-08 ENCOUNTER — Other Ambulatory Visit: Payer: Self-pay | Admitting: Podiatry

## 2022-10-08 DIAGNOSIS — L97511 Non-pressure chronic ulcer of other part of right foot limited to breakdown of skin: Secondary | ICD-10-CM | POA: Diagnosis not present

## 2022-10-08 DIAGNOSIS — L97519 Non-pressure chronic ulcer of other part of right foot with unspecified severity: Secondary | ICD-10-CM | POA: Diagnosis not present

## 2022-10-08 DIAGNOSIS — C7651 Malignant neoplasm of right lower limb: Secondary | ICD-10-CM | POA: Diagnosis not present

## 2022-10-08 DIAGNOSIS — C44722 Squamous cell carcinoma of skin of right lower limb, including hip: Secondary | ICD-10-CM | POA: Diagnosis not present

## 2022-10-08 MED ORDER — DOXYCYCLINE HYCLATE 100 MG PO TABS: 100.0000 mg | ORAL_TABLET | Freq: Two times a day (BID) | ORAL | 0 refills | Status: DC

## 2022-10-08 NOTE — Progress Notes (Signed)
Postop antibiotic sent

## 2022-10-09 ENCOUNTER — Telehealth: Payer: Self-pay | Admitting: Podiatry

## 2022-10-09 DIAGNOSIS — E1122 Type 2 diabetes mellitus with diabetic chronic kidney disease: Secondary | ICD-10-CM | POA: Diagnosis not present

## 2022-10-09 DIAGNOSIS — N1832 Chronic kidney disease, stage 3b: Secondary | ICD-10-CM | POA: Diagnosis not present

## 2022-10-09 DIAGNOSIS — K219 Gastro-esophageal reflux disease without esophagitis: Secondary | ICD-10-CM | POA: Diagnosis not present

## 2022-10-09 DIAGNOSIS — I700 Atherosclerosis of aorta: Secondary | ICD-10-CM | POA: Diagnosis not present

## 2022-10-09 DIAGNOSIS — R413 Other amnesia: Secondary | ICD-10-CM | POA: Diagnosis not present

## 2022-10-09 DIAGNOSIS — E1142 Type 2 diabetes mellitus with diabetic polyneuropathy: Secondary | ICD-10-CM | POA: Diagnosis not present

## 2022-10-09 DIAGNOSIS — I10 Essential (primary) hypertension: Secondary | ICD-10-CM | POA: Diagnosis not present

## 2022-10-09 NOTE — Telephone Encounter (Signed)
Dr Claudette Laws pathologist from [facility] called with stat biopsy results from yesterday.

## 2022-10-10 ENCOUNTER — Other Ambulatory Visit: Payer: Self-pay | Admitting: Podiatry

## 2022-10-10 DIAGNOSIS — L98499 Non-pressure chronic ulcer of skin of other sites with unspecified severity: Secondary | ICD-10-CM

## 2022-10-13 ENCOUNTER — Telehealth: Payer: Self-pay | Admitting: *Deleted

## 2022-10-13 ENCOUNTER — Ambulatory Visit (INDEPENDENT_AMBULATORY_CARE_PROVIDER_SITE_OTHER): Payer: Medicare Other | Admitting: *Deleted

## 2022-10-13 ENCOUNTER — Telehealth: Payer: Self-pay | Admitting: Podiatry

## 2022-10-13 VITALS — BP 158/68 | HR 86

## 2022-10-13 DIAGNOSIS — Z9889 Other specified postprocedural states: Secondary | ICD-10-CM

## 2022-10-13 DIAGNOSIS — L98499 Non-pressure chronic ulcer of skin of other sites with unspecified severity: Secondary | ICD-10-CM

## 2022-10-13 DIAGNOSIS — C801 Malignant (primary) neoplasm, unspecified: Secondary | ICD-10-CM

## 2022-10-13 NOTE — Telephone Encounter (Signed)
Janet Gilbert with the skin surgery center called and would like to speak with Dr Jacqualyn Posey about the biopsy results. She states that the orders mention pictures were taken and she has questions about the ulcer/site.  Please call her directly at [phone]  Please advise

## 2022-10-13 NOTE — Progress Notes (Signed)
Patient presents today for post op visit # 1, patient of Dr. Jacqualyn Posey   POV #1 DOS 10/08/2022 RT FOOT BIOPSY OF SKIN LESION   She states that it hasn't really hurt at all.   Patient presents in her surgical shoe weightbearing. Denies any falls or injury to the foot. Foot is slightly swollen. No signs of infection. No calf pain or shortness of breath. Bandages dry and intact. Incision is intact.     BP: 158/68 P: 86     No xrays taken today. Dr. Jacqualyn Posey did take a look at the foot today as well.   Foot redressed today and placed back in the shoe. Reviewed icing and elevation. Patient will follow up with Dr. Jacqualyn Posey for POV# 2.

## 2022-10-13 NOTE — Telephone Encounter (Signed)
Patient's daughter is calling to request surgical notes from surgery, wanted the name of the diagnosis of the type of cancer, information given and transferred to surgical coordinator for further notes.

## 2022-10-27 ENCOUNTER — Ambulatory Visit (INDEPENDENT_AMBULATORY_CARE_PROVIDER_SITE_OTHER): Payer: Medicare Other | Admitting: Podiatry

## 2022-10-27 DIAGNOSIS — C801 Malignant (primary) neoplasm, unspecified: Secondary | ICD-10-CM

## 2022-10-27 DIAGNOSIS — L98499 Non-pressure chronic ulcer of skin of other sites with unspecified severity: Secondary | ICD-10-CM

## 2022-11-01 DIAGNOSIS — L98499 Non-pressure chronic ulcer of skin of other sites with unspecified severity: Secondary | ICD-10-CM | POA: Insufficient documentation

## 2022-11-01 DIAGNOSIS — C801 Malignant (primary) neoplasm, unspecified: Secondary | ICD-10-CM | POA: Insufficient documentation

## 2022-11-01 NOTE — Progress Notes (Signed)
Subjective:   Patient ID: Janet Gilbert, female   DOB: 89 y.o.   MRN: [id number]   HPI Chief Complaint  Patient presents with   Routine Post Op    POV #2 DOS 10/08/2022 RT FOOT BIOPSY OF SKIN LESION     89 year old female presents the office today with the above concerns.  She is scheduled to see the skin cancer center next week.  She has no new concerns.  No fever or chills.  No other concerns.      Objective:  Physical Exam  General: NAD  Dermatological: On the plantar aspect the right foot continues to be a granular wound.  There is no probing to bone or tunneling.  Slightly smaller today but remains about the same.  No surrounding erythema, ascending cellulitis.  No fluctuance or crepitation.  No malodor.  Vascular: Dorsalis Pedis artery and Posterior Tibial artery pedal pulses are palpable bilateral with immedate capillary fill time. There is no pain with calf compression, swelling, warmth, erythema.   Neruologic: Sensation decreased with SWMF.   Musculoskeletal: No significant pain on exam.      Assessment:   Ulceration right foot     Plan:  -Treatment options discussed including all alternatives, risks, and complications -Etiology of symptoms were discussed -Wound is stable without any signs of infection.  She has an appointment next week to follow-up with skin cancer center.  If she has any issues to let me know otherwise I will see her back as needed.  Trula Slade DPM

## 2022-11-07 DIAGNOSIS — C44722 Squamous cell carcinoma of skin of right lower limb, including hip: Secondary | ICD-10-CM | POA: Diagnosis not present

## 2022-11-19 ENCOUNTER — Other Ambulatory Visit: Payer: Self-pay | Admitting: Physician Assistant

## 2022-11-19 DIAGNOSIS — R4702 Dysphasia: Secondary | ICD-10-CM | POA: Diagnosis not present

## 2022-11-19 DIAGNOSIS — R1310 Dysphagia, unspecified: Secondary | ICD-10-CM

## 2022-11-19 DIAGNOSIS — R413 Other amnesia: Secondary | ICD-10-CM | POA: Diagnosis not present

## 2022-11-19 DIAGNOSIS — E1122 Type 2 diabetes mellitus with diabetic chronic kidney disease: Secondary | ICD-10-CM | POA: Diagnosis not present

## 2022-12-01 DIAGNOSIS — S91301A Unspecified open wound, right foot, initial encounter: Secondary | ICD-10-CM | POA: Diagnosis not present

## 2022-12-08 ENCOUNTER — Other Ambulatory Visit: Payer: Self-pay | Admitting: Physician Assistant

## 2022-12-08 ENCOUNTER — Ambulatory Visit
Admission: RE | Admit: 2022-12-08 | Discharge: 2022-12-08 | Disposition: A | Discharge status: Home / Self Care | Payer: Medicare Other | Source: Ambulatory Visit | Attending: Physician Assistant | Admitting: Physician Assistant

## 2022-12-08 DIAGNOSIS — R1310 Dysphagia, unspecified: Secondary | ICD-10-CM

## 2022-12-17 ENCOUNTER — Ambulatory Visit: Payer: Medicare Other | Admitting: Podiatry

## 2022-12-17 VITALS — BP 129/54 | HR 71

## 2022-12-17 DIAGNOSIS — B351 Tinea unguium: Secondary | ICD-10-CM

## 2022-12-17 DIAGNOSIS — M79674 Pain in right toe(s): Secondary | ICD-10-CM

## 2022-12-17 DIAGNOSIS — M79675 Pain in left toe(s): Secondary | ICD-10-CM

## 2022-12-17 NOTE — Progress Notes (Signed)

## 2023-01-08 DIAGNOSIS — I10 Essential (primary) hypertension: Secondary | ICD-10-CM | POA: Diagnosis not present

## 2023-01-08 DIAGNOSIS — F03A4 Unspecified dementia, mild, with anxiety: Secondary | ICD-10-CM | POA: Diagnosis not present

## 2023-01-08 DIAGNOSIS — E1122 Type 2 diabetes mellitus with diabetic chronic kidney disease: Secondary | ICD-10-CM | POA: Diagnosis not present

## 2023-03-19 ENCOUNTER — Encounter: Payer: Self-pay | Admitting: Podiatry

## 2023-03-19 ENCOUNTER — Ambulatory Visit (INDEPENDENT_AMBULATORY_CARE_PROVIDER_SITE_OTHER): Payer: Medicare Other | Admitting: Podiatry

## 2023-03-19 DIAGNOSIS — M79675 Pain in left toe(s): Secondary | ICD-10-CM

## 2023-03-19 DIAGNOSIS — M79674 Pain in right toe(s): Secondary | ICD-10-CM

## 2023-03-19 DIAGNOSIS — B351 Tinea unguium: Secondary | ICD-10-CM

## 2023-03-19 NOTE — Progress Notes (Signed)

## 2023-06-19 ENCOUNTER — Ambulatory Visit: Payer: Medicare Other | Admitting: Podiatry

## 2023-06-29 DIAGNOSIS — G4733 Obstructive sleep apnea (adult) (pediatric): Secondary | ICD-10-CM | POA: Diagnosis not present

## 2023-06-29 DIAGNOSIS — I10 Essential (primary) hypertension: Secondary | ICD-10-CM | POA: Diagnosis not present

## 2023-06-29 DIAGNOSIS — I700 Atherosclerosis of aorta: Secondary | ICD-10-CM | POA: Diagnosis not present

## 2023-06-29 DIAGNOSIS — M8580 Other specified disorders of bone density and structure, unspecified site: Secondary | ICD-10-CM | POA: Diagnosis not present

## 2023-06-29 DIAGNOSIS — R413 Other amnesia: Secondary | ICD-10-CM | POA: Diagnosis not present

## 2023-06-29 DIAGNOSIS — E1165 Type 2 diabetes mellitus with hyperglycemia: Secondary | ICD-10-CM | POA: Diagnosis not present

## 2023-06-29 DIAGNOSIS — E1142 Type 2 diabetes mellitus with diabetic polyneuropathy: Secondary | ICD-10-CM | POA: Diagnosis not present

## 2023-06-29 DIAGNOSIS — I129 Hypertensive chronic kidney disease with stage 1 through stage 4 chronic kidney disease, or unspecified chronic kidney disease: Secondary | ICD-10-CM | POA: Diagnosis not present

## 2023-06-29 DIAGNOSIS — Z23 Encounter for immunization: Secondary | ICD-10-CM | POA: Diagnosis not present

## 2023-06-29 DIAGNOSIS — Z0000 Encounter for general adult medical examination without abnormal findings: Secondary | ICD-10-CM | POA: Diagnosis not present

## 2023-06-29 DIAGNOSIS — E7800 Pure hypercholesterolemia, unspecified: Secondary | ICD-10-CM | POA: Diagnosis not present

## 2023-06-29 DIAGNOSIS — K219 Gastro-esophageal reflux disease without esophagitis: Secondary | ICD-10-CM | POA: Diagnosis not present

## 2023-06-29 DIAGNOSIS — E1122 Type 2 diabetes mellitus with diabetic chronic kidney disease: Secondary | ICD-10-CM | POA: Diagnosis not present

## 2023-10-19 DIAGNOSIS — M25552 Pain in left hip: Secondary | ICD-10-CM | POA: Diagnosis not present

## 2023-11-02 DIAGNOSIS — M25552 Pain in left hip: Secondary | ICD-10-CM | POA: Diagnosis not present

## 2023-11-04 ENCOUNTER — Encounter: Payer: Self-pay | Admitting: Physician Assistant

## 2023-12-05 NOTE — Progress Notes (Incomplete)
 Assessment/Plan:     Janet Gilbert is a very pleasant 89 y.o. year old RH female with a history of hypertension, hyperlipidemia, GERD, DM 2, CKD, osteopenia, OSA not on CPAP, seen today for evaluation of memory loss. MoCA today is  /30***.  Workup is in progress, but findings are suspicious for dementia*** Patient needs assistance with some ADLs.  Patient no longer drives.  Dementia with behavioral disturbance***  MRI brain without contrast to assess for underlying structural abnormality and assess vascular load  Check B12, TSH Recommend good control of cardiovascular risk factors.   Continue to control mood as per PCP, continue quetiapine, sertraline Fol[place]ow up in    Subjective:    The patient is accompanied by ***  who supplements the history.    How long did patient have memory difficulties?  For the last***.  Patient has difficulty remembering new information, recent conversations and names of people. repeats oneself?  Endorsed Disoriented when walking into a room?  Denies except occasionally not remembering what patient came to the room for ***  Leaving objects in unusual places?   Denies.  Wandering behavior? Denies.   Any personality changes, or depression, anxiety?  Has moments of irritability.*** Hallucinations or paranoia?  Denies.   Seizures? Denies.    Any sleep changes?  Sleeps well***does not sleep well***denies vivid dreams, REM behavior or sleepwalking.   Sleep apnea? Denies.   Any hygiene concerns?  Denies.   Independent of bathing and dressing?  Needs assistance getting dressed.*** Who is in charge of the medications? is in charge *** Who is in charge of the finances?   is in charge   *** Any changes in appetite?   Denies. ***   Patient have trouble swallowing?  Denies.   Does the patient cook?  No***  Any headaches?  Denies.   Chronic back pain?  Denies.   Ambulates with difficulty? Needs a walker to ambulate ***   Recent falls or head injuries? Denies.      Vision changes? Denies. Stroke like symptoms?  Denies.   Any tremors?  Denies.   Any anosmia?  Denies.   Any incontinence of urine? Denies.   Any bowel dysfunction? Denies.      Patient lives with husband***  History of heavy alcohol intake? Denies.   History of heavy tobacco use? Denies.   Family history of dementia?   ***Denies. Does patient drive?***No longer drives Retired, worked in Education officer, environmental.  Allergies  Allergen Reactions   Contrast Media [Iodinated Contrast Media] Hives and Shortness Of Breath    Hypaque 60%   Sulfa Antibiotics Hives and Shortness Of Breath   Azithromycin Rash   Codeine Nausea Only   Darvon [Propoxyphene] Nausea Only    Current Outpatient Medications  Medication Instructions   amLODipine (NORVASC) 5 mg, Oral, Daily   atorvastatin (LIPITOR) 20 mg, Oral, Daily   doxycycline (VIBRA-TABS) 100 mg, Oral, 2 times daily   metFORMIN (GLUCOPHAGE) 500-1,000 mg, Oral, 2 times daily with meals, Takes 1000 mg in the morning and 500mg  in the evening   Multiple Vitamin (MULTIVITAMIN WITH MINERALS) TABS tablet 1 tablet, Oral, Daily, Women's 50+   trolamine salicylate (ASPERCREME) 10 % cream 1 application , Topical, As needed     VITALS:  There were no vitals filed for this visit.    PHYSICAL EXAM   HEENT:  Normocephalic, atraumatic. The mucous membranes are moist. The superficial temporal arteries are without ropiness or tenderness. Cardiovascular: Regular rate and rhythm. Lungs:  Clear to auscultation bilaterally. Neck: There are no carotid bruits noted bilaterally.  NEUROLOGICAL:     No data to display              No data to display           Orientation:  Alert and oriented to person, not to place and time***. No aphasia or dysarthria. Fund of knowledge is reduced. Recent and remote memory impaired.  Attention and concentration are reduced.  Able to name objects and unable to repeat phrases. Delayed recall    Cranial nerves: There is good  facial symmetry. Extraocular muscles are intact and visual fields are full to confrontational testing. Speech is fluent and clear, no tongue deviation. Hearing is intact to conversational tone.*** Tone: Tone is good throughout. Sensation: Sensation is intact to light touch and pinprick throughout. Vibration is intact at the bilateral big toe. Coordination: The patient has no difficulty with RAM's or FNF bilaterally. Normal finger to nose  Motor: Strength is 5/5 in the bilateral upper and lower extremities. There is no pronator drift. There are no fasciculations noted. DTR's: Deep tendon reflexes are 2/4 .  Plantar responses are downgoing bilaterally. Gait and Station: The patient is able to ambulate with difficulty. Needs a walker to ambulate ***. Gait is cautious and narrow.      Thank you for allowing [place] the opportunity to participate in the care of this nice patient. Please do not hesitate to contact us for any questions or concerns.   Total time spent on today's visit was *** minutes dedicated to this patient today, preparing to see patient, examining the patient, ordering tests and/or medications and counseling the patient, documenting clinical information in the EHR or other health record, independently interpreting results and communicating results to the patient/family, discussing treatment and goals, answering patient's questions and coordinating care.  Cc:  Kirby Funk, MD (Inactive)  Marlowe Kays 12/05/2023 6:52 PM

## 2023-12-08 ENCOUNTER — Encounter: Payer: Self-pay | Admitting: Physician Assistant

## 2023-12-08 ENCOUNTER — Ambulatory Visit: Payer: Medicare Other

## 2023-12-08 ENCOUNTER — Ambulatory Visit: Payer: Medicare Other | Admitting: Physician Assistant

## 2023-12-08 DIAGNOSIS — F0280 Dementia in other diseases classified elsewhere without behavioral disturbance: Secondary | ICD-10-CM | POA: Diagnosis not present

## 2023-12-08 DIAGNOSIS — G309 Alzheimer's disease, unspecified: Secondary | ICD-10-CM

## 2023-12-08 NOTE — Patient Instructions (Addendum)
 It was a pleasure to see you today at our office.   Recommendations:   Recommend visiting the website : " Dementia [facility]" to better understand some behaviors related to memory loss.       Continue Seroquel and Zoloft, may consider mirtazapine or trazodone to help . Can titrate to confort No indication for further studies or memory medicines  Consider memory care     For psychiatric meds, mood meds: Please have your primary care physician manage these medications.  If you have any severe symptoms of a stroke, or other severe issues such as confusion,severe chills or fever, etc call 911 or go to the ER as you may need to be evaluated further   For guidance regarding [facility] and if placement were needed at the facility, contact Social Worker tel: [phone]  For assessment of decision of mental capacity and competency:  Call Dr. Erick Blinks, geriatric psychiatrist at [phone]  Counseling regarding caregiver distress, including caregiver depression, anxiety and issues regarding community resources, adult day care programs, adult living facilities, or memory care questions:  please contact your  Primary Doctor's Social Worker   Whom to call: Memory  decline, memory medications: Call our office [phone]    [email or web address]   RECOMMENDATIONS FOR ALL PATIENTS WITH MEMORY PROBLEMS: 1. Continue to exercise (Recommend 30 minutes of walking everyday, or 3 hours every week) 2. Increase social interactions - continue going to [place] and enjoy social gatherings with friends and family 3. Eat healthy, avoid fried foods and eat more fruits and vegetables 4. Maintain adequate blood pressure, blood sugar, and blood cholesterol level. Reducing the risk of stroke and cardiovascular disease also helps promoting better memory. 5. Avoid stressful situations. Live a simple life and avoid aggravations. Organize  your time and prepare for the next day in anticipation. 6. Sleep well, avoid any interruptions of sleep and avoid any distractions in the bedroom that may interfere with adequate sleep quality 7. Avoid sugar, avoid sweets as there is a strong link between excessive sugar intake, diabetes, and cognitive impairment We discussed the Mediterranean diet, which has been shown to help patients reduce the risk of progressive memory disorders and reduces cardiovascular risk. This includes eating fish, eat fruits and green leafy vegetables, nuts like almonds and hazelnuts, walnuts, and also use olive oil. Avoid fast foods and fried foods as much as possible. Avoid sweets and sugar as sugar use has been linked to worsening of memory function.  There is always a concern of gradual progression of memory problems. If this is the case, then we may need to adjust level of care according to patient needs. Support, both to the patient and caregiver, should then be put into place.      You have been referred for a neuropsychological evaluation (i.e., evaluation of memory and thinking abilities). Please bring someone with you to this appointment if possible, as it is helpful for the doctor to hear from both you and another adult who knows you well. Please bring eyeglasses and hearing aids if you wear them.    The evaluation will take approximately 3 hours and has two parts:   The first part is a clinical interview with the neuropsychologist (Dr. Milbert Coulter or Dr. Robbie Lis). During the interview, the neuropsychologist will speak with you and the individual you brought to the appointment.    The second part of the evaluation is testing with the doctor's technician Annabelle Harman or Selena Batten). During the testing, the technician will  ask you to remember different types of material, solve problems, and answer some questionnaires. Your family member will not be present for this portion of the evaluation.   Please note: We must reserve several  hours of the neuropsychologist's time and the psychometrician's time for your evaluation appointment. As such, there is a No-Show fee of $100. If you are unable to attend any of your appointments, please contact our office as soon as possible to reschedule.      DRIVING: Regarding driving, in patients with progressive memory problems, driving will be impaired. We advise to have someone else do the driving if trouble finding directions or if minor accidents are reported. Independent driving assessment is available to determine safety of driving.   If you are interested in the driving assessment, you can contact the following:  [facility] [employer] [facility] in [place] [phone]  [facility] [phone]  [facility] [phone]  [facility] [phone] or [phone]   FALL PRECAUTIONS: Be cautious when walking. Scan the area for obstacles that may increase the risk of trips and falls. When getting up in the mornings, sit up at the edge of the bed for a few minutes before getting out of bed. Consider elevating the bed at the head end to avoid drop of blood pressure when getting up. Walk always in a well-lit room (use night lights in the walls). Avoid area rugs or power cords from appliances in the middle of the walkways. Use a walker or a cane if necessary and consider physical therapy for balance exercise. Get your eyesight checked regularly.  FINANCIAL OVERSIGHT: Supervision, especially oversight when making financial decisions or transactions is also recommended.  HOME SAFETY: Consider the safety of the kitchen when operating appliances like stoves, microwave oven, and blender. Consider having supervision and share cooking responsibilities until no longer able to participate in those. Accidents with firearms and other hazards in the house should be identified and addressed as well.   ABILITY TO BE LEFT ALONE: If patient is unable to contact 911  operator, consider using LifeLine, or when the need is there, arrange for someone to stay with patients. Smoking is a fire hazard, consider supervision or cessation. Risk of wandering should be assessed by caregiver and if detected at any point, supervision and safe proof recommendations should be instituted.  MEDICATION SUPERVISION: Inability to self-administer medication needs to be constantly addressed. Implement a mechanism to ensure safe administration of the medications.      Mediterranean Diet A Mediterranean diet refers to food and lifestyle choices that are based on the traditions of countries located on the [employer] [place]. This way of eating has been shown to help prevent certain conditions and improve outcomes for people who have chronic diseases, like kidney disease and heart disease. What are tips for following this plan? Lifestyle  Cook and eat meals together with your family, when possible. Drink enough fluid to keep your urine clear or pale yellow. Be physically active every day. This includes: Aerobic exercise like running or swimming. Leisure activities like gardening, walking, or housework. Get 7-8 hours of sleep each night. If recommended by your health care provider, drink red wine in moderation. This means 1 glass a day for nonpregnant women and 2 glasses a day for men. A glass of wine equals 5 oz (150 mL). Reading food labels  Check the serving size of packaged foods. For foods such as rice and pasta, the serving size refers to the amount of cooked product, not  dry. Check the total fat in packaged foods. Avoid foods that have saturated fat or trans fats. Check the ingredients list for added sugars, such as corn syrup. Shopping  At the grocery store, buy most of your food from the areas near the walls of the store. This includes: Fresh fruits and vegetables (produce). Grains, beans, nuts, and seeds. Some of these may be available in unpackaged forms or large amounts  (in bulk). Fresh seafood. Poultry and eggs. Low-fat dairy products. Buy whole ingredients instead of prepackaged foods. Buy fresh fruits and vegetables in-season from local farmers markets. Buy frozen fruits and vegetables in resealable bags. If you do not have access to quality fresh seafood, buy precooked frozen shrimp or canned fish, such as tuna, salmon, or sardines. Buy small amounts of raw or cooked vegetables, salads, or olives from the deli or salad bar at your store. Stock your pantry so you always have certain foods on hand, such as olive oil, canned tuna, canned tomatoes, rice, pasta, and beans. Cooking  Cook foods with extra-virgin olive oil instead of using butter or other vegetable oils. Have meat as a side dish, and have vegetables or grains as your main dish. This means having meat in small portions or adding small amounts of meat to foods like pasta or stew. Use beans or vegetables instead of meat in common dishes like chili or lasagna. Experiment with different cooking methods. Try roasting or broiling vegetables instead of steaming or sauteing them. Add frozen vegetables to soups, stews, pasta, or rice. Add nuts or seeds for added healthy fat at each meal. You can add these to yogurt, salads, or vegetable dishes. Marinate fish or vegetables using olive oil, lemon juice, garlic, and fresh herbs. Meal planning  Plan to eat 1 vegetarian meal one day each week. Try to work up to 2 vegetarian meals, if possible. Eat seafood 2 or more times a week. Have healthy snacks readily available, such as: Vegetable sticks with hummus. Greek yogurt. Fruit and nut trail mix. Eat balanced meals throughout the week. This includes: Fruit: 2-3 servings a day Vegetables: 4-5 servings a day Low-fat dairy: 2 servings a day Fish, poultry, or lean meat: 1 serving a day Beans and legumes: 2 or more servings a week Nuts and seeds: 1-2 servings a day Whole grains: 6-8 servings a  day Extra-virgin olive oil: 3-4 servings a day Limit red meat and sweets to only a few servings a month What are my food choices? Mediterranean diet Recommended Grains: Whole-grain pasta. Brown rice. Bulgar wheat. Polenta. Couscous. Whole-wheat bread. Orpah Cobb. Vegetables: Artichokes. Beets. Broccoli. Cabbage. Carrots. Eggplant. Green beans. Chard. Kale. Spinach. Onions. Leeks. Peas. Squash. Tomatoes. Peppers. Radishes. Fruits: Apples. Apricots. Avocado. Berries. Bananas. Cherries. Dates. Figs. Grapes. Lemons. Melon. Oranges. Peaches. Plums. Pomegranate. Meats and other protein foods: Beans. Almonds. Sunflower seeds. Pine nuts. Peanuts. Cod. Salmon. Scallops. Shrimp. Tuna. Tilapia. Clams. Oysters. Eggs. Dairy: Low-fat milk. Cheese. Greek yogurt. Beverages: Water. Red wine. Herbal tea. Fats and oils: Extra virgin olive oil. Avocado oil. Grape seed oil. Sweets and desserts: [place] yogurt with honey. Baked apples. Poached pears. Trail mix. Seasoning and other foods: Basil. Cilantro. Coriander. Cumin. Mint. Parsley. Sage. Rosemary. Tarragon. Garlic. Oregano. Thyme. Pepper. Balsalmic vinegar. Tahini. Hummus. Tomato sauce. Olives. Mushrooms. Limit these Grains: Prepackaged pasta or rice dishes. Prepackaged cereal with added sugar. Vegetables: Deep fried potatoes (french fries). Fruits: Fruit canned in syrup. Meats and other protein foods: Beef. Pork. Lamb. Poultry with skin. Hot dogs. Tomasa Blase. Dairy: [employer]  cream. Sour cream. Whole milk. Beverages: Juice. Sugar-sweetened soft drinks. Beer. Liquor and spirits. Fats and oils: Butter. Canola oil. Vegetable oil. Beef fat (tallow). Lard. Sweets and desserts: Cookies. Cakes. Pies. Candy. Seasoning and other foods: Mayonnaise. Premade sauces and marinades. The items listed may not be a complete list. Talk with your dietitian about what dietary choices are right for you. Summary The Mediterranean diet includes both food and lifestyle choices. Eat a  variety of fresh fruits and vegetables, beans, nuts, seeds, and whole grains. Limit the amount of red meat and sweets that you eat. Talk with your health care provider about whether it is safe for you to drink red wine in moderation. This means 1 glass a day for nonpregnant women and 2 glasses a day for men. A glass of wine equals 5 oz (150 mL). This information is not intended to replace advice given to you by your health care provider. Make sure you discuss any questions you have with your health care provider. Document Released: 05/01/2016 Document Revised: 06/03/2016 Document Reviewed: 05/01/2016 Elsevier Interactive Patient Education  2017 [employer].

## 2023-12-17 DIAGNOSIS — R413 Other amnesia: Secondary | ICD-10-CM | POA: Diagnosis not present

## 2023-12-28 DIAGNOSIS — M25562 Pain in left knee: Secondary | ICD-10-CM | POA: Diagnosis not present

## 2024-01-14 DIAGNOSIS — M25462 Effusion, left knee: Secondary | ICD-10-CM | POA: Diagnosis not present

## 2024-01-14 DIAGNOSIS — E1142 Type 2 diabetes mellitus with diabetic polyneuropathy: Secondary | ICD-10-CM | POA: Diagnosis not present

## 2024-01-14 DIAGNOSIS — R413 Other amnesia: Secondary | ICD-10-CM | POA: Diagnosis not present

## 2024-01-14 DIAGNOSIS — R634 Abnormal weight loss: Secondary | ICD-10-CM | POA: Diagnosis not present

## 2024-01-14 DIAGNOSIS — I10 Essential (primary) hypertension: Secondary | ICD-10-CM | POA: Diagnosis not present

## 2024-01-14 DIAGNOSIS — E7800 Pure hypercholesterolemia, unspecified: Secondary | ICD-10-CM | POA: Diagnosis not present

## 2024-01-14 DIAGNOSIS — E1122 Type 2 diabetes mellitus with diabetic chronic kidney disease: Secondary | ICD-10-CM | POA: Diagnosis not present

## 2024-02-01 DIAGNOSIS — Z87891 Personal history of nicotine dependence: Secondary | ICD-10-CM | POA: Diagnosis not present

## 2024-02-01 DIAGNOSIS — E1142 Type 2 diabetes mellitus with diabetic polyneuropathy: Secondary | ICD-10-CM | POA: Diagnosis not present

## 2024-02-01 DIAGNOSIS — N183 Chronic kidney disease, stage 3 unspecified: Secondary | ICD-10-CM | POA: Diagnosis not present

## 2024-02-01 DIAGNOSIS — Z681 Body mass index (BMI) 19 or less, adult: Secondary | ICD-10-CM | POA: Diagnosis not present

## 2024-02-01 DIAGNOSIS — R634 Abnormal weight loss: Secondary | ICD-10-CM | POA: Diagnosis not present

## 2024-02-01 DIAGNOSIS — L821 Other seborrheic keratosis: Secondary | ICD-10-CM | POA: Diagnosis not present

## 2024-02-01 DIAGNOSIS — E1122 Type 2 diabetes mellitus with diabetic chronic kidney disease: Secondary | ICD-10-CM | POA: Diagnosis not present

## 2024-02-01 DIAGNOSIS — E7800 Pure hypercholesterolemia, unspecified: Secondary | ICD-10-CM | POA: Diagnosis not present

## 2024-02-01 DIAGNOSIS — Z79899 Other long term (current) drug therapy: Secondary | ICD-10-CM | POA: Diagnosis not present

## 2024-02-01 DIAGNOSIS — M25462 Effusion, left knee: Secondary | ICD-10-CM | POA: Diagnosis not present

## 2024-02-01 DIAGNOSIS — Z9181 History of falling: Secondary | ICD-10-CM | POA: Diagnosis not present

## 2024-02-01 DIAGNOSIS — R413 Other amnesia: Secondary | ICD-10-CM | POA: Diagnosis not present

## 2024-02-01 DIAGNOSIS — I129 Hypertensive chronic kidney disease with stage 1 through stage 4 chronic kidney disease, or unspecified chronic kidney disease: Secondary | ICD-10-CM | POA: Diagnosis not present

## 2024-02-02 DIAGNOSIS — R944 Abnormal results of kidney function studies: Secondary | ICD-10-CM | POA: Diagnosis not present

## 2024-02-05 DIAGNOSIS — L821 Other seborrheic keratosis: Secondary | ICD-10-CM | POA: Diagnosis not present

## 2024-02-05 DIAGNOSIS — Z87891 Personal history of nicotine dependence: Secondary | ICD-10-CM | POA: Diagnosis not present

## 2024-02-05 DIAGNOSIS — Z79899 Other long term (current) drug therapy: Secondary | ICD-10-CM | POA: Diagnosis not present

## 2024-02-05 DIAGNOSIS — I129 Hypertensive chronic kidney disease with stage 1 through stage 4 chronic kidney disease, or unspecified chronic kidney disease: Secondary | ICD-10-CM | POA: Diagnosis not present

## 2024-02-05 DIAGNOSIS — Z9181 History of falling: Secondary | ICD-10-CM | POA: Diagnosis not present

## 2024-02-05 DIAGNOSIS — R413 Other amnesia: Secondary | ICD-10-CM | POA: Diagnosis not present

## 2024-02-05 DIAGNOSIS — E1122 Type 2 diabetes mellitus with diabetic chronic kidney disease: Secondary | ICD-10-CM | POA: Diagnosis not present

## 2024-02-05 DIAGNOSIS — E1142 Type 2 diabetes mellitus with diabetic polyneuropathy: Secondary | ICD-10-CM | POA: Diagnosis not present

## 2024-02-05 DIAGNOSIS — N183 Chronic kidney disease, stage 3 unspecified: Secondary | ICD-10-CM | POA: Diagnosis not present

## 2024-02-05 DIAGNOSIS — M25462 Effusion, left knee: Secondary | ICD-10-CM | POA: Diagnosis not present

## 2024-02-05 DIAGNOSIS — Z681 Body mass index (BMI) 19 or less, adult: Secondary | ICD-10-CM | POA: Diagnosis not present

## 2024-02-05 DIAGNOSIS — R634 Abnormal weight loss: Secondary | ICD-10-CM | POA: Diagnosis not present

## 2024-02-05 DIAGNOSIS — E7800 Pure hypercholesterolemia, unspecified: Secondary | ICD-10-CM | POA: Diagnosis not present

## 2024-02-11 DIAGNOSIS — Z87891 Personal history of nicotine dependence: Secondary | ICD-10-CM | POA: Diagnosis not present

## 2024-02-11 DIAGNOSIS — Z681 Body mass index (BMI) 19 or less, adult: Secondary | ICD-10-CM | POA: Diagnosis not present

## 2024-02-11 DIAGNOSIS — E1122 Type 2 diabetes mellitus with diabetic chronic kidney disease: Secondary | ICD-10-CM | POA: Diagnosis not present

## 2024-02-11 DIAGNOSIS — L821 Other seborrheic keratosis: Secondary | ICD-10-CM | POA: Diagnosis not present

## 2024-02-11 DIAGNOSIS — M25462 Effusion, left knee: Secondary | ICD-10-CM | POA: Diagnosis not present

## 2024-02-11 DIAGNOSIS — R413 Other amnesia: Secondary | ICD-10-CM | POA: Diagnosis not present

## 2024-02-11 DIAGNOSIS — Z79899 Other long term (current) drug therapy: Secondary | ICD-10-CM | POA: Diagnosis not present

## 2024-02-11 DIAGNOSIS — E7800 Pure hypercholesterolemia, unspecified: Secondary | ICD-10-CM | POA: Diagnosis not present

## 2024-02-11 DIAGNOSIS — E1142 Type 2 diabetes mellitus with diabetic polyneuropathy: Secondary | ICD-10-CM | POA: Diagnosis not present

## 2024-02-11 DIAGNOSIS — I129 Hypertensive chronic kidney disease with stage 1 through stage 4 chronic kidney disease, or unspecified chronic kidney disease: Secondary | ICD-10-CM | POA: Diagnosis not present

## 2024-02-11 DIAGNOSIS — Z9181 History of falling: Secondary | ICD-10-CM | POA: Diagnosis not present

## 2024-02-11 DIAGNOSIS — N183 Chronic kidney disease, stage 3 unspecified: Secondary | ICD-10-CM | POA: Diagnosis not present

## 2024-02-11 DIAGNOSIS — R634 Abnormal weight loss: Secondary | ICD-10-CM | POA: Diagnosis not present

## 2024-02-20 ENCOUNTER — Emergency Department (HOSPITAL_COMMUNITY)

## 2024-02-20 ENCOUNTER — Emergency Department (HOSPITAL_BASED_OUTPATIENT_CLINIC_OR_DEPARTMENT_OTHER)

## 2024-02-20 ENCOUNTER — Inpatient Hospital Stay (HOSPITAL_COMMUNITY)
Admission: EM | Admit: 2024-02-20 | Discharge: 2024-02-26 | DRG: 300 | Disposition: A | Discharge status: Hospice | Attending: Family Medicine | Admitting: Family Medicine

## 2024-02-20 ENCOUNTER — Other Ambulatory Visit: Payer: Self-pay

## 2024-02-20 ENCOUNTER — Encounter (HOSPITAL_COMMUNITY): Payer: Self-pay | Admitting: Internal Medicine

## 2024-02-20 DIAGNOSIS — R609 Edema, unspecified: Secondary | ICD-10-CM | POA: Diagnosis not present

## 2024-02-20 DIAGNOSIS — Z91041 Radiographic dye allergy status: Secondary | ICD-10-CM

## 2024-02-20 DIAGNOSIS — D631 Anemia in chronic kidney disease: Secondary | ICD-10-CM | POA: Diagnosis present

## 2024-02-20 DIAGNOSIS — N1832 Chronic kidney disease, stage 3b: Secondary | ICD-10-CM | POA: Diagnosis present

## 2024-02-20 DIAGNOSIS — Z96611 Presence of right artificial shoulder joint: Secondary | ICD-10-CM | POA: Diagnosis present

## 2024-02-20 DIAGNOSIS — I700 Atherosclerosis of aorta: Secondary | ICD-10-CM | POA: Diagnosis present

## 2024-02-20 DIAGNOSIS — I129 Hypertensive chronic kidney disease with stage 1 through stage 4 chronic kidney disease, or unspecified chronic kidney disease: Secondary | ICD-10-CM | POA: Diagnosis present

## 2024-02-20 DIAGNOSIS — Z90710 Acquired absence of both cervix and uterus: Secondary | ICD-10-CM

## 2024-02-20 DIAGNOSIS — I82402 Acute embolism and thrombosis of unspecified deep veins of left lower extremity: Secondary | ICD-10-CM | POA: Diagnosis present

## 2024-02-20 DIAGNOSIS — N179 Acute kidney failure, unspecified: Principal | ICD-10-CM | POA: Diagnosis present

## 2024-02-20 DIAGNOSIS — I82422 Acute embolism and thrombosis of left iliac vein: Secondary | ICD-10-CM | POA: Diagnosis not present

## 2024-02-20 DIAGNOSIS — D649 Anemia, unspecified: Secondary | ICD-10-CM | POA: Diagnosis present

## 2024-02-20 DIAGNOSIS — E1151 Type 2 diabetes mellitus with diabetic peripheral angiopathy without gangrene: Secondary | ICD-10-CM | POA: Diagnosis not present

## 2024-02-20 DIAGNOSIS — Z515 Encounter for palliative care: Secondary | ICD-10-CM | POA: Diagnosis not present

## 2024-02-20 DIAGNOSIS — M85862 Other specified disorders of bone density and structure, left lower leg: Secondary | ICD-10-CM | POA: Diagnosis not present

## 2024-02-20 DIAGNOSIS — E871 Hypo-osmolality and hyponatremia: Secondary | ICD-10-CM | POA: Diagnosis present

## 2024-02-20 DIAGNOSIS — N184 Chronic kidney disease, stage 4 (severe): Secondary | ICD-10-CM | POA: Diagnosis present

## 2024-02-20 DIAGNOSIS — Z7984 Long term (current) use of oral hypoglycemic drugs: Secondary | ICD-10-CM

## 2024-02-20 DIAGNOSIS — J45909 Unspecified asthma, uncomplicated: Secondary | ICD-10-CM | POA: Diagnosis not present

## 2024-02-20 DIAGNOSIS — M25562 Pain in left knee: Secondary | ICD-10-CM | POA: Diagnosis present

## 2024-02-20 DIAGNOSIS — R52 Pain, unspecified: Secondary | ICD-10-CM

## 2024-02-20 DIAGNOSIS — W19XXXA Unspecified fall, initial encounter: Secondary | ICD-10-CM | POA: Diagnosis present

## 2024-02-20 DIAGNOSIS — E1122 Type 2 diabetes mellitus with diabetic chronic kidney disease: Secondary | ICD-10-CM | POA: Diagnosis present

## 2024-02-20 DIAGNOSIS — Z885 Allergy status to narcotic agent status: Secondary | ICD-10-CM

## 2024-02-20 DIAGNOSIS — Z87891 Personal history of nicotine dependence: Secondary | ICD-10-CM

## 2024-02-20 DIAGNOSIS — F03918 Unspecified dementia, unspecified severity, with other behavioral disturbance: Secondary | ICD-10-CM | POA: Diagnosis present

## 2024-02-20 DIAGNOSIS — Z66 Do not resuscitate: Secondary | ICD-10-CM | POA: Diagnosis not present

## 2024-02-20 DIAGNOSIS — Z825 Family history of asthma and other chronic lower respiratory diseases: Secondary | ICD-10-CM

## 2024-02-20 DIAGNOSIS — Z882 Allergy status to sulfonamides status: Secondary | ICD-10-CM

## 2024-02-20 DIAGNOSIS — E875 Hyperkalemia: Secondary | ICD-10-CM | POA: Diagnosis present

## 2024-02-20 DIAGNOSIS — Z7189 Other specified counseling: Secondary | ICD-10-CM | POA: Diagnosis not present

## 2024-02-20 DIAGNOSIS — R000 Tachycardia, unspecified: Secondary | ICD-10-CM | POA: Diagnosis not present

## 2024-02-20 DIAGNOSIS — Z86718 Personal history of other venous thrombosis and embolism: Secondary | ICD-10-CM

## 2024-02-20 DIAGNOSIS — I82442 Acute embolism and thrombosis of left tibial vein: Secondary | ICD-10-CM | POA: Diagnosis present

## 2024-02-20 DIAGNOSIS — G3184 Mild cognitive impairment, so stated: Secondary | ICD-10-CM | POA: Insufficient documentation

## 2024-02-20 DIAGNOSIS — I82501 Chronic embolism and thrombosis of unspecified deep veins of right lower extremity: Secondary | ICD-10-CM | POA: Diagnosis present

## 2024-02-20 DIAGNOSIS — Z7901 Long term (current) use of anticoagulants: Secondary | ICD-10-CM

## 2024-02-20 DIAGNOSIS — K219 Gastro-esophageal reflux disease without esophagitis: Secondary | ICD-10-CM | POA: Diagnosis present

## 2024-02-20 DIAGNOSIS — R64 Cachexia: Secondary | ICD-10-CM | POA: Diagnosis present

## 2024-02-20 DIAGNOSIS — E8721 Acute metabolic acidosis: Secondary | ICD-10-CM | POA: Diagnosis not present

## 2024-02-20 DIAGNOSIS — M25462 Effusion, left knee: Secondary | ICD-10-CM | POA: Diagnosis present

## 2024-02-20 DIAGNOSIS — R531 Weakness: Secondary | ICD-10-CM | POA: Diagnosis not present

## 2024-02-20 DIAGNOSIS — I82412 Acute embolism and thrombosis of left femoral vein: Secondary | ICD-10-CM | POA: Diagnosis present

## 2024-02-20 DIAGNOSIS — R627 Adult failure to thrive: Secondary | ICD-10-CM | POA: Diagnosis not present

## 2024-02-20 DIAGNOSIS — I82432 Acute embolism and thrombosis of left popliteal vein: Secondary | ICD-10-CM | POA: Diagnosis not present

## 2024-02-20 DIAGNOSIS — M79662 Pain in left lower leg: Secondary | ICD-10-CM

## 2024-02-20 DIAGNOSIS — R5383 Other fatigue: Secondary | ICD-10-CM | POA: Diagnosis not present

## 2024-02-20 DIAGNOSIS — R636 Underweight: Secondary | ICD-10-CM | POA: Diagnosis present

## 2024-02-20 DIAGNOSIS — Z681 Body mass index (BMI) 19 or less, adult: Secondary | ICD-10-CM | POA: Diagnosis not present

## 2024-02-20 DIAGNOSIS — Z471 Aftercare following joint replacement surgery: Secondary | ICD-10-CM | POA: Diagnosis not present

## 2024-02-20 DIAGNOSIS — F05 Delirium due to known physiological condition: Secondary | ICD-10-CM | POA: Diagnosis present

## 2024-02-20 DIAGNOSIS — Z888 Allergy status to other drugs, medicaments and biological substances status: Secondary | ICD-10-CM

## 2024-02-20 DIAGNOSIS — G4733 Obstructive sleep apnea (adult) (pediatric): Secondary | ICD-10-CM | POA: Diagnosis present

## 2024-02-20 DIAGNOSIS — I82452 Acute embolism and thrombosis of left peroneal vein: Secondary | ICD-10-CM | POA: Diagnosis present

## 2024-02-20 DIAGNOSIS — E7800 Pure hypercholesterolemia, unspecified: Secondary | ICD-10-CM | POA: Diagnosis present

## 2024-02-20 DIAGNOSIS — F32A Depression, unspecified: Secondary | ICD-10-CM | POA: Diagnosis present

## 2024-02-20 DIAGNOSIS — I959 Hypotension, unspecified: Secondary | ICD-10-CM | POA: Diagnosis not present

## 2024-02-20 DIAGNOSIS — Z743 Need for continuous supervision: Secondary | ICD-10-CM | POA: Diagnosis not present

## 2024-02-20 DIAGNOSIS — Z79899 Other long term (current) drug therapy: Secondary | ICD-10-CM | POA: Diagnosis not present

## 2024-02-20 DIAGNOSIS — I10 Essential (primary) hypertension: Secondary | ICD-10-CM | POA: Diagnosis present

## 2024-02-20 DIAGNOSIS — I82511 Chronic embolism and thrombosis of right femoral vein: Secondary | ICD-10-CM | POA: Diagnosis present

## 2024-02-20 DIAGNOSIS — E860 Dehydration: Secondary | ICD-10-CM | POA: Diagnosis present

## 2024-02-20 DIAGNOSIS — I82462 Acute embolism and thrombosis of left calf muscular vein: Secondary | ICD-10-CM | POA: Diagnosis present

## 2024-02-20 DIAGNOSIS — I82409 Acute embolism and thrombosis of unspecified deep veins of unspecified lower extremity: Secondary | ICD-10-CM | POA: Diagnosis present

## 2024-02-20 DIAGNOSIS — M79606 Pain in leg, unspecified: Secondary | ICD-10-CM | POA: Diagnosis not present

## 2024-02-20 DIAGNOSIS — Z881 Allergy status to other antibiotic agents status: Secondary | ICD-10-CM

## 2024-02-20 DIAGNOSIS — F0393 Unspecified dementia, unspecified severity, with mood disturbance: Secondary | ICD-10-CM | POA: Diagnosis present

## 2024-02-20 DIAGNOSIS — F0390 Unspecified dementia without behavioral disturbance: Secondary | ICD-10-CM | POA: Diagnosis not present

## 2024-02-20 DIAGNOSIS — I824Y2 Acute embolism and thrombosis of unspecified deep veins of left proximal lower extremity: Secondary | ICD-10-CM | POA: Diagnosis not present

## 2024-02-20 DIAGNOSIS — Z8701 Personal history of pneumonia (recurrent): Secondary | ICD-10-CM

## 2024-02-20 DIAGNOSIS — F0394 Unspecified dementia, unspecified severity, with anxiety: Secondary | ICD-10-CM | POA: Diagnosis present

## 2024-02-20 LAB — URINALYSIS, ROUTINE W REFLEX MICROSCOPIC
Bilirubin Urine: NEGATIVE
Glucose, UA: NEGATIVE mg/dL
Hgb urine dipstick: NEGATIVE
Ketones, ur: NEGATIVE mg/dL
Leukocytes,Ua: NEGATIVE
Nitrite: NEGATIVE
Protein, ur: 30 mg/dL — AB
Specific Gravity, Urine: 1.014 (ref 1.005–1.030)
pH: 5 (ref 5.0–8.0)

## 2024-02-20 LAB — CBC WITH DIFFERENTIAL/PLATELET
Abs Immature Granulocytes: 0.03 10*3/uL (ref 0.00–0.07)
Basophils Absolute: 0 10*3/uL (ref 0.0–0.1)
Basophils Relative: 0 %
Eosinophils Absolute: 0 10*3/uL (ref 0.0–0.5)
Eosinophils Relative: 0 %
HCT: 27.6 % — ABNORMAL LOW (ref 36.0–46.0)
Hemoglobin: 8.6 g/dL — ABNORMAL LOW (ref 12.0–15.0)
Immature Granulocytes: 0 %
Lymphocytes Relative: 15 %
Lymphs Abs: 1.4 10*3/uL (ref 0.7–4.0)
MCH: 28.5 pg (ref 26.0–34.0)
MCHC: 31.2 g/dL (ref 30.0–36.0)
MCV: 91.4 fL (ref 80.0–100.0)
Monocytes Absolute: 0.9 10*3/uL (ref 0.1–1.0)
Monocytes Relative: 10 %
Neutro Abs: 6.7 10*3/uL (ref 1.7–7.7)
Neutrophils Relative %: 75 %
Platelets: 269 10*3/uL (ref 150–400)
RBC: 3.02 MIL/uL — ABNORMAL LOW (ref 3.87–5.11)
RDW: 15.2 % (ref 11.5–15.5)
WBC: 9 10*3/uL (ref 4.0–10.5)
nRBC: 0 % (ref 0.0–0.2)

## 2024-02-20 LAB — COMPREHENSIVE METABOLIC PANEL WITH GFR
ALT: 18 U/L (ref 0–44)
AST: 34 U/L (ref 15–41)
Albumin: 3.5 g/dL (ref 3.5–5.0)
Alkaline Phosphatase: 74 U/L (ref 38–126)
Anion gap: 16 — ABNORMAL HIGH (ref 5–15)
BUN: 65 mg/dL — ABNORMAL HIGH (ref 8–23)
CO2: 15 mmol/L — ABNORMAL LOW (ref 22–32)
Calcium: 9 mg/dL (ref 8.9–10.3)
Chloride: 99 mmol/L (ref 98–111)
Creatinine, Ser: 3.12 mg/dL — ABNORMAL HIGH (ref 0.44–1.00)
GFR, Estimated: 14 mL/min — ABNORMAL LOW (ref >60)
Glucose, Bld: 168 mg/dL — ABNORMAL HIGH (ref 70–99)
Potassium: 5.3 mmol/L — ABNORMAL HIGH (ref 3.5–5.1)
Sodium: 130 mmol/L — ABNORMAL LOW (ref 135–145)
Total Bilirubin: 0.9 mg/dL (ref 0.0–1.2)
Total Protein: 8.1 g/dL (ref 6.5–8.1)

## 2024-02-20 LAB — MAGNESIUM: Magnesium: 2.4 mg/dL (ref 1.7–2.4)

## 2024-02-20 LAB — GLUCOSE, CAPILLARY: Glucose-Capillary: 158 mg/dL — ABNORMAL HIGH (ref 70–99)

## 2024-02-20 LAB — I-STAT CG4 LACTIC ACID, ED: Lactic Acid, Venous: 0.5 mmol/L (ref 0.5–1.9)

## 2024-02-20 MED ORDER — MIRTAZAPINE 15 MG PO TABS
7.5000 mg | ORAL_TABLET | Freq: Every day | ORAL | Status: DC
  Administered 2024-02-20 – 2024-02-25 (×6): 7.5 mg via ORAL
  Filled 2024-02-20 (×6): qty 1

## 2024-02-20 MED ORDER — HEPARIN BOLUS VIA INFUSION
2500.0000 [IU] | Freq: Once | INTRAVENOUS | Status: AC
  Administered 2024-02-20: 2500 [IU] via INTRAVENOUS
  Filled 2024-02-20: qty 2500

## 2024-02-20 MED ORDER — HEPARIN (PORCINE) 25000 UT/250ML-% IV SOLN
650.0000 [IU]/h | INTRAVENOUS | Status: AC
  Administered 2024-02-20: 700 [IU]/h via INTRAVENOUS
  Administered 2024-02-22: 650 [IU]/h via INTRAVENOUS
  Filled 2024-02-20 (×2): qty 250

## 2024-02-20 MED ORDER — SODIUM CHLORIDE 0.9 % IV SOLN: INTRAVENOUS | Status: AC

## 2024-02-20 MED ORDER — PANTOPRAZOLE SODIUM 40 MG PO TBEC
40.0000 mg | DELAYED_RELEASE_TABLET | Freq: Every day | ORAL | Status: DC
  Administered 2024-02-20 – 2024-02-24 (×5): 40 mg via ORAL
  Filled 2024-02-20 (×5): qty 1

## 2024-02-20 MED ORDER — AMLODIPINE BESYLATE 5 MG PO TABS
5.0000 mg | ORAL_TABLET | Freq: Every day | ORAL | Status: DC
  Administered 2024-02-21: 5 mg via ORAL
  Filled 2024-02-20: qty 1

## 2024-02-20 MED ORDER — ONDANSETRON HCL 4 MG/2ML IJ SOLN: 4.0000 mg | Freq: Four times a day (QID) | INTRAMUSCULAR | Status: DC | PRN

## 2024-02-20 MED ORDER — ACETAMINOPHEN 650 MG RE SUPP: 650.0000 mg | Freq: Four times a day (QID) | RECTAL | Status: DC | PRN

## 2024-02-20 MED ORDER — ONDANSETRON HCL 4 MG PO TABS: 4.0000 mg | ORAL_TABLET | Freq: Four times a day (QID) | ORAL | Status: DC | PRN

## 2024-02-20 MED ORDER — SODIUM CHLORIDE 0.9 % IV BOLUS
1000.0000 mL | Freq: Once | INTRAVENOUS | Status: AC
  Administered 2024-02-20: 1000 mL via INTRAVENOUS

## 2024-02-20 MED ORDER — QUETIAPINE FUMARATE 25 MG PO TABS
50.0000 mg | ORAL_TABLET | Freq: Every day | ORAL | Status: DC
  Administered 2024-02-20 – 2024-02-25 (×6): 50 mg via ORAL
  Filled 2024-02-20 (×6): qty 2

## 2024-02-20 MED ORDER — SERTRALINE HCL 100 MG PO TABS
100.0000 mg | ORAL_TABLET | Freq: Every day | ORAL | Status: DC
  Administered 2024-02-20 – 2024-02-26 (×7): 100 mg via ORAL
  Filled 2024-02-20 (×7): qty 1

## 2024-02-20 MED ORDER — ATORVASTATIN CALCIUM 20 MG PO TABS
20.0000 mg | ORAL_TABLET | Freq: Every day | ORAL | Status: DC
  Administered 2024-02-21 – 2024-02-26 (×6): 20 mg via ORAL
  Filled 2024-02-20 (×6): qty 1

## 2024-02-20 MED ORDER — ACETAMINOPHEN 325 MG PO TABS
650.0000 mg | ORAL_TABLET | Freq: Four times a day (QID) | ORAL | Status: DC | PRN
  Administered 2024-02-21: 650 mg via ORAL
  Filled 2024-02-20 (×2): qty 2

## 2024-02-20 MED ORDER — ENSURE PLUS HIGH PROTEIN PO LIQD
237.0000 mL | Freq: Two times a day (BID) | ORAL | Status: DC
  Administered 2024-02-21 – 2024-02-26 (×11): 237 mL via ORAL

## 2024-02-20 MED ORDER — RISPERIDONE 0.5 MG PO TBDP
0.5000 mg | ORAL_TABLET | Freq: Every evening | ORAL | Status: DC | PRN
  Administered 2024-02-24 – 2024-02-25 (×2): 0.5 mg via ORAL
  Filled 2024-02-20 (×3): qty 1

## 2024-02-20 MED ORDER — INSULIN ASPART 100 UNIT/ML IJ SOLN
0.0000 [IU] | Freq: Three times a day (TID) | INTRAMUSCULAR | Status: DC
  Administered 2024-02-21: 2 [IU] via SUBCUTANEOUS
  Administered 2024-02-21: 1 [IU] via SUBCUTANEOUS
  Administered 2024-02-21: 2 [IU] via SUBCUTANEOUS
  Administered 2024-02-22: 1 [IU] via SUBCUTANEOUS
  Administered 2024-02-22: 2 [IU] via SUBCUTANEOUS
  Administered 2024-02-22: 1 [IU] via SUBCUTANEOUS
  Administered 2024-02-23: 3 [IU] via SUBCUTANEOUS
  Administered 2024-02-23 (×2): 2 [IU] via SUBCUTANEOUS
  Administered 2024-02-24 (×2): 3 [IU] via SUBCUTANEOUS
  Administered 2024-02-24 – 2024-02-25 (×2): 1 [IU] via SUBCUTANEOUS
  Administered 2024-02-25 (×2): 3 [IU] via SUBCUTANEOUS
  Administered 2024-02-26: 2 [IU] via SUBCUTANEOUS

## 2024-02-20 NOTE — H&P (Signed)
 History and Physical    Patient: Janet Gilbert [id number] DOB: 11/24/1931 DOA: 02/20/2024 DOS: the patient was seen and examined on 02/20/2024 PCP: Lysle Saunas, MD (Inactive)  Patient coming from: Home  Chief Complaint: Left lower extremity edema.  HPI: Janet Gilbert is a 89 y.o. female with medical history significant of osteoarthritis, asthma, type 2 diabetes, hiatal hernia, hypertension, peripheral vascular disease, history of pneumonia, sleep apnea not on CPAP who was brought to the emergency department with complaints of progressive decline, decreased oral intake and not taking medications. History of left lower extremity injury several days ago with development of edema.No chest pain, palpitations, diaphoresis, PND or orthopnea. No abdominal pain, nausea, emesis, diarrhea, constipation, melena or hematochezia. No flank pain, dysuria, frequency or hematuria. No polyuria, polydipsia, polyphagia or blurred vision.   Lab work: Urinalysis was hazy with protein of 30 mg/dL and rare bacteria.  Lactic acid was normal.  Magnesium is 2.4 mg/dL.  CMP showed sodium 130, potassium 5.3, chloride 99 and CO2 15 mmol/L with an anion gap of 16.  Glucose under 68, BUN 65 creatinine 3.12 mg/dL.  Her last creatinine level done in her PCPs office was .  LFTs were normal.  Imaging: Portable 1 view chest radiograph with no acute cardiopulmonary abnormality.  Left knee x-ray with moderate joint effusion, but no acute fracture or dislocation.  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  There is acute DVT of the left external iliac vein, left common femoral vein S junction, left femoral vein, left proximal profunda vein, left popliteal vein, left posterior tibial veins, and left peroneal veins.   ED course: 97.9 F, pulse 94, respiration 18, BP 130/67 mmHg O2 sat 100% on room air.  The patient received 1000 mL normal saline bolus and was started on IV heparin.  Review of  Systems: As mentioned in the history of present illness. All other systems reviewed and are negative.  Past Medical History:  Diagnosis Date   Arthritis    Asthma    child   Diabetes mellitus without complication (HCC)    Heart murmur    History of hiatal hernia    Hypertension    Peripheral vascular disease (HCC)    left leg  dvt after hysterectomy 47 yrs ago   Pneumonia    grade school   Sleep apnea    pos study lost wt and does not use cpap last test 5-6 yrs ago   Past Surgical History:  Procedure Laterality Date   ABDOMINAL HYSTERECTOMY  1947   ELBOW SURGERY Right 90's   nerves   REVERSE SHOULDER ARTHROPLASTY Right 01/01/2017   Procedure: REVERSE SHOULDER ARTHROPLASTY;  Surgeon: Sammye Cristal, MD;  Location: [facility] OR;  Service: Orthopedics;  Laterality: Right;  RIGHT REVERSE SHOULDER ARTHROPLASTY   REVERSE TOTAL SHOULDER ARTHROPLASTY Right 01/01/2017   Social History:  reports that she quit smoking about 47 years ago. Her smoking use included cigarettes. She started smoking about 52 years ago. She has never used smokeless tobacco. She reports current alcohol use. She reports that she does not currently use drugs.  Allergies  Allergen Reactions   Contrast Media [Iodinated Contrast Media] Hives and Shortness Of Breath    Hypaque 60%   Sulfa Antibiotics Hives and Shortness Of Breath   Azithromycin Rash   Codeine Nausea Only   Darvon [Propoxyphene] Nausea Only    No family history on file.  Prior to Admission medications   Medication Sig Start Date  End Date Taking? Authorizing Provider  LORazepam (ATIVAN) 0.5 MG tablet Take by mouth. 02/02/24  Yes [provider]  mirtazapine (REMERON) 7.5 MG tablet Take 7.5 mg by mouth at bedtime. 02/14/24  Yes [provider]  pantoprazole (PROTONIX) 40 MG tablet Take 40 mg by mouth daily. 02/02/24  Yes [provider]  sertraline (ZOLOFT) 100 MG tablet Take 100 mg by mouth daily. 01/22/24  Yes [provider]  amLODipine  (NORVASC ) 5 MG tablet Take 5 mg by mouth daily.    [provider]  atorvastatin  (LIPITOR) 20 MG tablet Take 20 mg by mouth daily.    [provider]  doxycycline  (VIBRA -TABS) 100 MG tablet Take 1 tablet (100 mg total) by mouth 2 (two) times daily. 10/08/22   Charity Conch, DPM  metFORMIN  (GLUCOPHAGE ) 1000 MG tablet Take 500-1,000 mg by mouth 2 (two) times daily with a meal. Takes 1000 mg in the morning and 500mg  in the evening    [provider]  Multiple Vitamin (MULTIVITAMIN WITH MINERALS) TABS tablet Take 1 tablet by mouth daily. Women's 50+    [provider]  trolamine salicylate (ASPERCREME) 10 % cream Apply 1 application topically as needed for muscle pain.    [provider]    Physical Exam: Vitals:   02/20/24 1036 02/20/24 1050 02/20/24 1200  BP: 130/67  132/64  Pulse: 94  90  Resp: 18  16  Temp: 97.9 F (36.6 C)    TempSrc: Oral    SpO2: 100% 100% 100%   Physical Exam Vitals and nursing note reviewed.  Constitutional:      General: She is awake. She is not in acute distress.    Appearance: Normal appearance. She is ill-appearing.  HENT:     Head: Normocephalic.     Nose: No rhinorrhea.     Mouth/Throat:     Mouth: Mucous membranes are moist.  Eyes:     General: No scleral icterus.    Pupils: Pupils are equal, round, and reactive to light.  Neck:     Vascular: No JVD.  Cardiovascular:     Rate and Rhythm: Normal rate and regular rhythm.     Heart sounds: S1 normal and S2 normal.  Pulmonary:     Effort: Pulmonary effort is normal.     Breath sounds: Normal breath sounds. No wheezing, rhonchi or rales.  Abdominal:     General: Bowel sounds are normal. There is no distension.     Palpations: Abdomen is soft.     Tenderness: There is no abdominal tenderness. There is no guarding.  Musculoskeletal:     Cervical back: Neck supple.     Right lower leg: No edema.     Left lower leg: Edema present.   Skin:    General: Skin is warm and dry.  Neurological:     General: No focal deficit present.     Mental Status: She is alert and oriented to person, place, and time.  Psychiatric:        Mood and Affect: Mood normal.        Behavior: Behavior normal. Behavior is cooperative.     Data Reviewed:  Results are pending, will review when available.  Assessment and Plan: Principal Problem:   Acute deep vein thrombosis (DVT) of left lower extremity (HCC) Observation/PCU. Continue heparin infusion. Will switch to DOAC in 1-2 days. Obtain echocardiogram.  Active Problems:   Hyperkalemia In the setting of:   Volume depletion And:  Stage 3b chronic kidney disease (HCC) Continue IVF. Follow up renal function and electrolytes in AM.    Gastroesophageal reflux disease without esophagitis Antiacid, H2 blocker or PPI as needed.    Essential hypertension Continue amlodipine  5 mg po daily.    Aortic atherosclerosis (HCC)   Hypercholesterolemia Continue atorvastatin  20 mg po daily.    Obstructive sleep apnea Does not use CPAP.     Advance Care Planning:   Code Status: Full Code   Consults:   Family Communication:   Severity of Illness: The appropriate patient status for this patient is OBSERVATION. Observation status is judged to be reasonable and necessary in order to provide the required intensity of service to ensure the patient's safety. The patient's presenting symptoms, physical exam findings, and initial radiographic and laboratory data in the context of their medical condition is felt to place them at decreased risk for further clinical deterioration. Furthermore, it is anticipated that the patient will be medically stable for discharge from the hospital within 2 midnights of admission.   Author: Danice Dural, MD 02/20/2024 1:41 PM  For on call review [email or web address].   This document was prepared using Dragon voice recognition software and may contain some  unintended transcription errors.

## 2024-02-20 NOTE — ED Triage Notes (Signed)
 Pt BIB EMS coming from home, per Pt daughter, pt has been progressively declining, not eating, not taking meds. She had recent soft tissue injury to left extremity that is swollen today. Daughter concern for dehydration because of diarrhea that started yesterday.  Hx: Dementia and DM. Patient alert to self  BP 104/60, HR 110, Spo2 100%, CBG 232

## 2024-02-20 NOTE — ED Provider Notes (Signed)
 [place] [facility]MENT AT [facility] Provider Note   CSN: [id number] Arrival date & time: 02/20/24  1024     History  No chief complaint on file.   Janet Gilbert is a 89 y.o. female.  89 yo F with a chief complaint of not eating or drinking.  Has had some loose stools as well.  Going on for about a week or so.  She will not tell me why she does not want to eat or drink.  No cough or congestion.  No reported abdominal pain.  Denies urinary symptoms.  Family has noted that her left leg has gotten significantly swollen.  She had reportedly fallen and hurt her knee.  Had had x-rays done that did not show an obvious fracture.  Thought to be soft tissue swelling from the PCP.        Home Medications Prior to Admission medications   Medication Sig Start Date End Date Taking? Authorizing Provider  LORazepam (ATIVAN) 0.5 MG tablet Take by mouth. 02/02/24  Yes [provider]  mirtazapine (REMERON) 7.5 MG tablet Take 7.5 mg by mouth at bedtime. 02/14/24  Yes [provider]  pantoprazole (PROTONIX) 40 MG tablet Take 40 mg by mouth daily. 02/02/24  Yes [provider]  sertraline (ZOLOFT) 100 MG tablet Take 100 mg by mouth daily. 01/22/24  Yes [provider]  amLODipine  (NORVASC ) 5 MG tablet Take 5 mg by mouth daily.    [provider]  atorvastatin  (LIPITOR) 20 MG tablet Take 20 mg by mouth daily.    [provider]  doxycycline  (VIBRA -TABS) 100 MG tablet Take 1 tablet (100 mg total) by mouth 2 (two) times daily. 10/08/22   Charity Conch, DPM  metFORMIN  (GLUCOPHAGE ) 1000 MG tablet Take 500-1,000 mg by mouth 2 (two) times daily with a meal. Takes 1000 mg in the morning and 500mg  in the evening    [provider]  Multiple Vitamin (MULTIVITAMIN WITH MINERALS) TABS tablet Take 1 tablet by mouth daily. Women's 50+    [provider]  trolamine salicylate (ASPERCREME) 10 % cream Apply 1 application  topically as needed for muscle pain.    [provider]      Allergies    Contrast media [iodinated contrast media], Sulfa antibiotics, Azithromycin, Codeine, and Darvon [propoxyphene]    Review of Systems   Review of Systems  Physical Exam Updated Vital Signs BP (!) 145/72 (BP Location: Right Arm)   Pulse 92   Temp 97.9 F (36.6 C) (Oral)   Resp 14   Ht 5\' 2"  (1.575 m)   Wt 40.8 kg   SpO2 100%   BMI 16.46 kg/m  Physical Exam Vitals and nursing note reviewed.  Constitutional:      General: She is not in acute distress.    Appearance: She is well-developed. She is not diaphoretic.  HENT:     Head: Normocephalic and atraumatic.  Eyes:     Pupils: Pupils are equal, round, and reactive to light.  Cardiovascular:     Rate and Rhythm: Normal rate and regular rhythm.     Heart sounds: No murmur heard.    No friction rub. No gallop.  Pulmonary:     Effort: Pulmonary effort is normal.     Breath sounds: No wheezing or rales.  Abdominal:     General: There is no distension.     Palpations: Abdomen is soft.     Tenderness: There is no abdominal tenderness.  Musculoskeletal:        General: No tenderness.     Cervical back: Normal range of motion and neck supple.  Skin:    General: Skin is warm and dry.  Neurological:     Mental Status: She is alert and oriented to person, place, and time.  Psychiatric:        Behavior: Behavior normal.     ED Results / Procedures / Treatments   Labs (all labs ordered are listed, but only abnormal results are displayed) Labs Reviewed  COMPREHENSIVE METABOLIC PANEL WITH GFR - Abnormal; Notable for the following components:      Result Value   Sodium 130 (*)    Potassium 5.3 (*)    CO2 15 (*)    Glucose, Bld 168 (*)    BUN 65 (*)    Creatinine, Ser 3.12 (*)    GFR, Estimated 14 (*)    Anion gap 16 (*)    All other components within normal limits  URINALYSIS, ROUTINE W REFLEX MICROSCOPIC - Abnormal; Notable for the  following components:   APPearance HAZY (*)    Protein, ur 30 (*)    Bacteria, UA RARE (*)    All other components within normal limits  CBC WITH DIFFERENTIAL/PLATELET - Abnormal; Notable for the following components:   RBC 3.02 (*)    Hemoglobin 8.6 (*)    HCT 27.6 (*)    All other components within normal limits  MAGNESIUM  CBC WITH DIFFERENTIAL/PLATELET  HEPARIN LEVEL (UNFRACTIONATED)  I-STAT CG4 LACTIC ACID, ED    EKG None  Radiology VAS US  LOWER EXTREMITY VENOUS (DVT) Result Date: 02/20/2024  Lower Venous DVT Study Patient Name:  Janet Gilbert  Date of Exam:   02/20/2024 Medical Rec #: [id number]       Accession #:    [id number] Date of Birth: 07-Mar-1932       Patient Gender: F Patient Age:   73 years Exam Location:  [facility] Procedure:      VAS US  LOWER EXTREMITY VENOUS (DVT) Referring Phys: Frona Yost --------------------------------------------------------------------------------  Indications: Pain.  Risk Factors: None identified. Limitations: Poor ultrasound/tissue interface and patient positioning, patient immobility. Comparison Study: No prior studies. Performing Technologist: Lerry Ransom RVT  Examination Guidelines: A complete evaluation includes B-mode imaging, spectral Doppler, color Doppler, and power Doppler as needed of all accessible portions of each vessel. Bilateral testing is considered an integral part of a complete examination. Limited examinations for reoccurring indications may be performed as noted. The reflux portion of the exam is performed with the patient in reverse Trendelenburg.  +-----+---------------+---------+-----------+----------+--------------+ RIGHTCompressibilityPhasicitySpontaneityPropertiesThrombus Aging +-----+---------------+---------+-----------+----------+--------------+ CFV  Partial        Yes      Yes                  Chronic        +-----+---------------+---------+-----------+----------+--------------+    +---------+---------------+---------+-----------+----------+--------------+ LEFT     CompressibilityPhasicitySpontaneityPropertiesThrombus Aging +---------+---------------+---------+-----------+----------+--------------+ CFV      None           No       No                   Acute          +---------+---------------+---------+-----------+----------+--------------+ SFJ      None           No       No  Acute          +---------+---------------+---------+-----------+----------+--------------+ FV Prox  None           No       No                   Acute          +---------+---------------+---------+-----------+----------+--------------+ FV Mid   None           No       No                   Acute          +---------+---------------+---------+-----------+----------+--------------+ FV DistalNone           No       No                   Acute          +---------+---------------+---------+-----------+----------+--------------+ PFV      None           No       No                   Acute          +---------+---------------+---------+-----------+----------+--------------+ POP      None           No       No                   Acute          +---------+---------------+---------+-----------+----------+--------------+ PTV      None                                         Acute          +---------+---------------+---------+-----------+----------+--------------+ PERO     None                                         Acute          +---------+---------------+---------+-----------+----------+--------------+ Gastroc  None                                         Acute          +---------+---------------+---------+-----------+----------+--------------+ EIV      None           No       No                   Acute          +---------+---------------+---------+-----------+----------+--------------+ CIV                     Yes      Yes                                  +---------+---------------+---------+-----------+----------+--------------+    Summary: RIGHT: - Findings consistent with chronic deep vein thrombosis involving the right common femoral vein.  LEFT: - Findings consistent with acute deep vein thrombosis involving the left external iliac vein, left common femoral vein, SF junction, left femoral vein,  left proximal profunda vein, left popliteal vein, left posterior tibial veins, and left peroneal veins. Findings consistent with acute intramuscular thrombosis involving the left gastrocnemius veins.  *See table(s) above for measurements and observations.    Preliminary    DG Knee Complete 4 Views Left Result Date: 02/20/2024 CLINICAL DATA:  fatigue EXAM: LEFT KNEE - COMPLETE 4+ VIEW COMPARISON:  None Available. FINDINGS: Osteopenia. No acute fracture or dislocation. Joint spaces and alignment are maintained. No area of erosion or osseous destruction. No unexpected radiopaque foreign body. Joint effusion, moderate. IMPRESSION: Moderate joint effusion. No acute fracture or dislocation. Electronically Signed   By: Clancy Crimes M.D.   On: 02/20/2024 12:11   DG Chest Port 1 View Result Date: 02/20/2024 CLINICAL DATA:  fatigue EXAM: PORTABLE CHEST 1 VIEW COMPARISON:  June 20, 2020 FINDINGS: The cardiomediastinal silhouette is unchanged in contour given differences in technique.Atherosclerotic calcifications. No pleural effusion. No pneumothorax. No acute pleuroparenchymal abnormality. Status post RIGHT shoulder arthroplasty. Surgical clips project over the RIGHT neck. IMPRESSION: No acute cardiopulmonary abnormality. Electronically Signed   By: Clancy Crimes M.D.   On: 02/20/2024 12:10    Procedures Procedures    Medications Ordered in ED Medications  heparin ADULT infusion 100 units/mL (25000 units/250mL) (has no administration in time range)  heparin bolus via infusion 2,500 Units (has no administration in time range)   acetaminophen  (TYLENOL ) tablet 650 mg (has no administration in time range)    Or  acetaminophen  (TYLENOL ) suppository 650 mg (has no administration in time range)  ondansetron  (ZOFRAN ) tablet 4 mg (has no administration in time range)    Or  ondansetron  (ZOFRAN ) injection 4 mg (has no administration in time range)  0.9 %  sodium chloride  infusion (has no administration in time range)  sodium chloride  0.9 % bolus 1,000 mL (0 mLs Intravenous Stopped 02/20/24 1315)  sodium chloride  0.9 % bolus 1,000 mL (1,000 mLs Intravenous New Bag/Given 02/20/24 1420)    ED Course/ Medical Decision Making/ A&P                                 Medical Decision Making Amount and/or Complexity of Data Reviewed Labs: ordered. Radiology: ordered.  Risk Prescription drug management. Decision regarding hospitalization.   89 yo F with a chief complaint left knee pain failure to thrive.  Has been going on for at least a week.  Family says she is lost a lot of weight.  Has had some slow worsening renal function over time.  Will obtain a laboratory evaluation.  Bolus of IV fluids.  Plain film of the left knee.  DVT study.  DVT study is positive for DVT.  Patient with an AKI.  I discussed results with patient and family.  Will continue IV fluids.  Discussed with medicine for admission.  The patients results and plan were reviewed and discussed.   Any x-rays performed were independently reviewed by myself.   Differential diagnosis were considered with the presenting HPI.  Medications  heparin ADULT infusion 100 units/mL (25000 units/250mL) (has no administration in time range)  heparin bolus via infusion 2,500 Units (has no administration in time range)  acetaminophen  (TYLENOL ) tablet 650 mg (has no administration in time range)    Or  acetaminophen  (TYLENOL ) suppository 650 mg (has no administration in time range)  ondansetron  (ZOFRAN ) tablet 4 mg (has no administration in time range)    Or   ondansetron  (ZOFRAN ) injection 4  mg (has no administration in time range)  0.9 %  sodium chloride  infusion (has no administration in time range)  sodium chloride  0.9 % bolus 1,000 mL (0 mLs Intravenous Stopped 02/20/24 1315)  sodium chloride  0.9 % bolus 1,000 mL (1,000 mLs Intravenous New Bag/Given 02/20/24 1420)    Vitals:   02/20/24 1050 02/20/24 1200 02/20/24 1433 02/20/24 1433  BP:  132/64  (!) 145/72  Pulse:  90  92  Resp:  16  14  Temp:   97.9 F (36.6 C)   TempSrc:   Oral   SpO2: 100% 100%  100%  Weight:    40.8 kg  Height:    5\' 2"  (1.575 m)    Final diagnoses:  AKI (acute kidney injury) (HCC)  Acute deep vein thrombosis (DVT) of iliac vein of left lower extremity (HCC)    Admission/ observation were discussed with the admitting physician, patient and/or family and they are comfortable with the plan.          Final Clinical Impression(s) / ED Diagnoses Final diagnoses:  AKI (acute kidney injury) (HCC)  Acute deep vein thrombosis (DVT) of iliac vein of left lower extremity [facility])    Rx / DC Orders ED Discharge Orders     None         Albertus Hughs, DO 02/20/24 1457

## 2024-02-20 NOTE — Progress Notes (Signed)
 PHARMACY - ANTICOAGULATION CONSULT NOTE  Pharmacy Consult for heparin Indication: DVT  Allergies  Allergen Reactions   Contrast Media [Iodinated Contrast Media] Hives and Shortness Of Breath    Hypaque 60%   Sulfa Antibiotics Hives and Shortness Of Breath   Azithromycin Rash   Codeine Nausea Only   Darvon [Propoxyphene] Nausea Only    Patient Measurements:  Recent encounter from January 14, 2024 Weight 96.4 lbs (43.7 kg) Height 63 inches  Vital Signs: Temp: 97.9 F (36.6 C) (05/31 1036) Temp Source: Oral (05/31 1036) BP: 132/64 (05/31 1200) Pulse Rate: 90 (05/31 1200)  Labs: Recent Labs    02/20/24 1126 02/20/24 1258  HGB  --  8.6*  HCT  --  27.6*  PLT  --  269  CREATININE 3.12*  --     CrCl cannot be calculated (Unknown ideal weight.).   Medical History: Past Medical History:  Diagnosis Date   Arthritis    Asthma    child   Diabetes mellitus without complication (HCC)    Heart murmur    History of hiatal hernia    Hypertension    Peripheral vascular disease (HCC)    left leg  dvt after hysterectomy 47 yrs ago   Pneumonia    grade school   Sleep apnea    pos study lost wt and does not use cpap last test 5-6 yrs ago     Assessment: 89 year old female presented with swelling of left lower extremity. Patient reports hurting leg recently. Patient not eating and drinking as well for a week or so. Ultrasound lower extremities with chronic DVT in right common femoral vein as well as acute DVT on left. No anticoagulation noted PTA. Pharmacy consulted to manage heparin.  Goal of Therapy:  Heparin level 0.3-0.7 units/ml Monitor platelets by anticoagulation protocol: Yes   Plan:  -Heparin 2500 unit bolus -Heparin infusion at 700 units/hr -Check heparin level 8 hours after start of infusion -Daily CBC  Lolita Rise, PharmD, BCPS Clinical Pharmacist 02/20/2024 2:11 PM

## 2024-02-20 NOTE — ED Notes (Signed)
 Ultrasound at bedside

## 2024-02-20 NOTE — Progress Notes (Signed)
 Left lower extremity venous duplex has been completed. Preliminary results can be found in CV Proc through chart review.  Results were given to Dr. Inga Manges.  02/20/24 12:47 PM Birda Buffy RVT

## 2024-02-21 ENCOUNTER — Observation Stay (HOSPITAL_BASED_OUTPATIENT_CLINIC_OR_DEPARTMENT_OTHER)

## 2024-02-21 DIAGNOSIS — N184 Chronic kidney disease, stage 4 (severe): Secondary | ICD-10-CM | POA: Diagnosis present

## 2024-02-21 DIAGNOSIS — Z7401 Bed confinement status: Secondary | ICD-10-CM | POA: Diagnosis not present

## 2024-02-21 DIAGNOSIS — F05 Delirium due to known physiological condition: Secondary | ICD-10-CM | POA: Diagnosis present

## 2024-02-21 DIAGNOSIS — F0394 Unspecified dementia, unspecified severity, with anxiety: Secondary | ICD-10-CM | POA: Diagnosis present

## 2024-02-21 DIAGNOSIS — E871 Hypo-osmolality and hyponatremia: Secondary | ICD-10-CM | POA: Diagnosis present

## 2024-02-21 DIAGNOSIS — I82422 Acute embolism and thrombosis of left iliac vein: Secondary | ICD-10-CM | POA: Diagnosis present

## 2024-02-21 DIAGNOSIS — I129 Hypertensive chronic kidney disease with stage 1 through stage 4 chronic kidney disease, or unspecified chronic kidney disease: Secondary | ICD-10-CM | POA: Diagnosis present

## 2024-02-21 DIAGNOSIS — E1151 Type 2 diabetes mellitus with diabetic peripheral angiopathy without gangrene: Secondary | ICD-10-CM | POA: Diagnosis present

## 2024-02-21 DIAGNOSIS — F32A Depression, unspecified: Secondary | ICD-10-CM | POA: Diagnosis present

## 2024-02-21 DIAGNOSIS — F03918 Unspecified dementia, unspecified severity, with other behavioral disturbance: Secondary | ICD-10-CM | POA: Diagnosis present

## 2024-02-21 DIAGNOSIS — I82432 Acute embolism and thrombosis of left popliteal vein: Secondary | ICD-10-CM | POA: Diagnosis present

## 2024-02-21 DIAGNOSIS — F0393 Unspecified dementia, unspecified severity, with mood disturbance: Secondary | ICD-10-CM | POA: Diagnosis present

## 2024-02-21 DIAGNOSIS — R531 Weakness: Secondary | ICD-10-CM | POA: Diagnosis not present

## 2024-02-21 DIAGNOSIS — E8721 Acute metabolic acidosis: Secondary | ICD-10-CM | POA: Diagnosis present

## 2024-02-21 DIAGNOSIS — R52 Pain, unspecified: Secondary | ICD-10-CM | POA: Diagnosis not present

## 2024-02-21 DIAGNOSIS — I82452 Acute embolism and thrombosis of left peroneal vein: Secondary | ICD-10-CM | POA: Diagnosis present

## 2024-02-21 DIAGNOSIS — R0602 Shortness of breath: Secondary | ICD-10-CM

## 2024-02-21 DIAGNOSIS — R410 Disorientation, unspecified: Secondary | ICD-10-CM | POA: Diagnosis not present

## 2024-02-21 DIAGNOSIS — I82442 Acute embolism and thrombosis of left tibial vein: Secondary | ICD-10-CM | POA: Diagnosis present

## 2024-02-21 DIAGNOSIS — M25562 Pain in left knee: Secondary | ICD-10-CM | POA: Diagnosis not present

## 2024-02-21 DIAGNOSIS — I82409 Acute embolism and thrombosis of unspecified deep veins of unspecified lower extremity: Secondary | ICD-10-CM | POA: Diagnosis present

## 2024-02-21 DIAGNOSIS — I824Y2 Acute embolism and thrombosis of unspecified deep veins of left proximal lower extremity: Secondary | ICD-10-CM | POA: Diagnosis not present

## 2024-02-21 DIAGNOSIS — F0390 Unspecified dementia without behavioral disturbance: Secondary | ICD-10-CM | POA: Diagnosis not present

## 2024-02-21 DIAGNOSIS — Z681 Body mass index (BMI) 19 or less, adult: Secondary | ICD-10-CM | POA: Diagnosis not present

## 2024-02-21 DIAGNOSIS — E1122 Type 2 diabetes mellitus with diabetic chronic kidney disease: Secondary | ICD-10-CM | POA: Diagnosis present

## 2024-02-21 DIAGNOSIS — Z66 Do not resuscitate: Secondary | ICD-10-CM | POA: Diagnosis not present

## 2024-02-21 DIAGNOSIS — I700 Atherosclerosis of aorta: Secondary | ICD-10-CM | POA: Diagnosis present

## 2024-02-21 DIAGNOSIS — N179 Acute kidney failure, unspecified: Secondary | ICD-10-CM | POA: Diagnosis not present

## 2024-02-21 DIAGNOSIS — Z515 Encounter for palliative care: Secondary | ICD-10-CM | POA: Diagnosis not present

## 2024-02-21 DIAGNOSIS — J45909 Unspecified asthma, uncomplicated: Secondary | ICD-10-CM | POA: Diagnosis present

## 2024-02-21 DIAGNOSIS — Z743 Need for continuous supervision: Secondary | ICD-10-CM | POA: Diagnosis not present

## 2024-02-21 DIAGNOSIS — R627 Adult failure to thrive: Secondary | ICD-10-CM | POA: Diagnosis not present

## 2024-02-21 DIAGNOSIS — D631 Anemia in chronic kidney disease: Secondary | ICD-10-CM | POA: Diagnosis present

## 2024-02-21 DIAGNOSIS — Z7189 Other specified counseling: Secondary | ICD-10-CM | POA: Diagnosis not present

## 2024-02-21 DIAGNOSIS — I82501 Chronic embolism and thrombosis of unspecified deep veins of right lower extremity: Secondary | ICD-10-CM | POA: Diagnosis present

## 2024-02-21 DIAGNOSIS — R64 Cachexia: Secondary | ICD-10-CM | POA: Diagnosis present

## 2024-02-21 DIAGNOSIS — N1832 Chronic kidney disease, stage 3b: Secondary | ICD-10-CM | POA: Diagnosis not present

## 2024-02-21 DIAGNOSIS — W19XXXA Unspecified fall, initial encounter: Secondary | ICD-10-CM | POA: Diagnosis present

## 2024-02-21 LAB — IRON AND TIBC
Iron: 7 ug/dL — ABNORMAL LOW (ref 28–170)
Saturation Ratios: 4 % — ABNORMAL LOW (ref 10.4–31.8)
TIBC: 174 ug/dL — ABNORMAL LOW (ref 250–450)
UIBC: 167 ug/dL

## 2024-02-21 LAB — COMPREHENSIVE METABOLIC PANEL WITH GFR
ALT: 22 U/L (ref 0–44)
AST: 37 U/L (ref 15–41)
Albumin: 2.8 g/dL — ABNORMAL LOW (ref 3.5–5.0)
Alkaline Phosphatase: 66 U/L (ref 38–126)
Anion gap: 11 (ref 5–15)
BUN: 60 mg/dL — ABNORMAL HIGH (ref 8–23)
CO2: 16 mmol/L — ABNORMAL LOW (ref 22–32)
Calcium: 8 mg/dL — ABNORMAL LOW (ref 8.9–10.3)
Chloride: 106 mmol/L (ref 98–111)
Creatinine, Ser: 2.22 mg/dL — ABNORMAL HIGH (ref 0.44–1.00)
GFR, Estimated: 21 mL/min — ABNORMAL LOW (ref >60)
Glucose, Bld: 188 mg/dL — ABNORMAL HIGH (ref 70–99)
Potassium: 4.3 mmol/L (ref 3.5–5.1)
Sodium: 133 mmol/L — ABNORMAL LOW (ref 135–145)
Total Bilirubin: 0.8 mg/dL (ref 0.0–1.2)
Total Protein: 6.6 g/dL (ref 6.5–8.1)

## 2024-02-21 LAB — ECHOCARDIOGRAM COMPLETE
AR max vel: 2.35 cm2
AV Area VTI: 2.03 cm2
AV Area mean vel: 2.29 cm2
AV Mean grad: 4.4 mmHg
AV Peak grad: 8.9 mmHg
Ao pk vel: 1.49 m/s
Area-P 1/2: 3.92 cm2
Calc EF: 68.7 %
Height: 62 in
P 1/2 time: 354 ms
S' Lateral: 1.9 cm
Single Plane A2C EF: 72.7 %
Single Plane A4C EF: 70.4 %
Weight: 1440 [oz_av]

## 2024-02-21 LAB — CBC
HCT: 29.4 % — ABNORMAL LOW (ref 36.0–46.0)
Hemoglobin: 9 g/dL — ABNORMAL LOW (ref 12.0–15.0)
MCH: 28.6 pg (ref 26.0–34.0)
MCHC: 30.6 g/dL (ref 30.0–36.0)
MCV: 93.3 fL (ref 80.0–100.0)
Platelets: 331 10*3/uL (ref 150–400)
RBC: 3.15 MIL/uL — ABNORMAL LOW (ref 3.87–5.11)
RDW: 15.3 % (ref 11.5–15.5)
WBC: 8.8 10*3/uL (ref 4.0–10.5)
nRBC: 0 % (ref 0.0–0.2)

## 2024-02-21 LAB — GLUCOSE, CAPILLARY
Glucose-Capillary: 127 mg/dL — ABNORMAL HIGH (ref 70–99)
Glucose-Capillary: 168 mg/dL — ABNORMAL HIGH (ref 70–99)
Glucose-Capillary: 159 mg/dL — ABNORMAL HIGH (ref 70–99)
Glucose-Capillary: 161 mg/dL — ABNORMAL HIGH (ref 70–99)

## 2024-02-21 LAB — HEPARIN LEVEL (UNFRACTIONATED)
Heparin Unfractionated: 0.81 [IU]/mL — ABNORMAL HIGH (ref 0.30–0.70)
Heparin Unfractionated: 0.54 [IU]/mL (ref 0.30–0.70)
Heparin Unfractionated: 0.3 [IU]/mL (ref 0.30–0.70)

## 2024-02-21 LAB — FERRITIN: Ferritin: 253 ng/mL (ref 11–307)

## 2024-02-21 LAB — VITAMIN B12: Vitamin B-12: 745 pg/mL (ref 180–914)

## 2024-02-21 LAB — FOLATE: Folate: 16.5 ng/mL (ref >5.9)

## 2024-02-21 MED ORDER — SODIUM CHLORIDE 0.9 % IV SOLN: INTRAVENOUS | Status: DC

## 2024-02-21 MED ORDER — ACETAMINOPHEN 500 MG PO TABS: 1000.0000 mg | ORAL_TABLET | Freq: Every evening | ORAL | Status: DC | PRN

## 2024-02-21 MED ORDER — LORAZEPAM 0.5 MG PO TABS: 0.5000 mg | ORAL_TABLET | Freq: Every evening | ORAL | Status: DC | PRN

## 2024-02-21 MED ORDER — DIPHENHYDRAMINE HCL 25 MG PO CAPS: 50.0000 mg | ORAL_CAPSULE | Freq: Every evening | ORAL | Status: DC | PRN

## 2024-02-21 MED ORDER — DIPHENHYDRAMINE-APAP (SLEEP) 25-500 MG PO TABS: 2.0000 | ORAL_TABLET | Freq: Every evening | ORAL | Status: DC | PRN

## 2024-02-21 NOTE — Progress Notes (Signed)
 PHARMACY - ANTICOAGULATION CONSULT NOTE  Pharmacy Consult for heparin Indication: DVT  Allergies  Allergen Reactions   Contrast Media [Iodinated Contrast Media] Hives and Shortness Of Breath    Hypaque 60%   Sulfa Antibiotics Hives and Shortness Of Breath   Azithromycin Rash   Codeine Nausea Only   Darvon [Propoxyphene] Nausea Only    Patient Measurements: Height: 5\' 2"  (157.5 cm) Weight: 40.8 kg (90 lb) IBW/kg (Calculated) : 50.1 HEPARIN DW (KG): 40.8Recent encounter from January 14, 2024 Weight 96.4 lbs (43.7 kg) Height 63 inches  Vital Signs: Temp: 98.4 F (36.9 C) (06/01 1143) Temp Source: Oral (06/01 1143) BP: 123/52 (06/01 1114) Pulse Rate: 92 (06/01 1143)  Labs: Recent Labs    02/20/24 1126 02/20/24 1258 02/21/24 0118 02/21/24 1006 02/21/24 1822  HGB  --  8.6* 9.0*  --   --   HCT  --  27.6* 29.4*  --   --   PLT  --  269 331  --   --   HEPARINUNFRC  --   --  0.81* 0.54 0.30  CREATININE 3.12*  --  2.22*  --   --     Estimated Creatinine Clearance: 10.8 mL/min (A) (by C-G formula based on SCr of 2.22 mg/dL (H)).   Medical History: Past Medical History:  Diagnosis Date   Arthritis    Asthma    child   Diabetes mellitus without complication (HCC)    Heart murmur    History of hiatal hernia    Hypertension    Peripheral vascular disease (HCC)    left leg  dvt after hysterectomy 47 yrs ago   Pneumonia    grade school   Sleep apnea    pos study lost wt and does not use cpap last test 5-6 yrs ago     Assessment: 89 year old female presented with swelling of left lower extremity. Patient reports hurting leg recently. Patient not eating and drinking as well for a week or so. Ultrasound lower extremities with chronic DVT in right common femoral vein as well as acute DVT on left. No anticoagulation noted PTA. Pharmacy consulted to manage heparin.  02/21/24 Heparin level 0.3 - therapeutic but on low end and trending down after previous decrease in heparin  infusion to 600 units/hr Hgb low but stable, plt WNL SCr 2.2 (trending down) No complications of therapy noted  Goal of Therapy:  Heparin level 0.3-0.7 units/ml Monitor platelets by anticoagulation protocol: Yes   Plan:  -Increase heparin infusion to 650 units/hr -Monitor daily heparin level, CBC, signs/symptoms of bleeding   Lolita Rise, PharmD, BCPS Clinical Pharmacist 02/21/2024 7:04 PM

## 2024-02-21 NOTE — TOC Initial Note (Signed)
 Transition of Care [facility]) - Initial/Assessment Note    Patient Details  Name: Janet Gilbert MRN: [id number] Date of Birth: [birth year]  [facility] CM/SW Contact:    Levie Ream, RN Phone Number: 02/21/2024, 2:40 PM  Clinical Narrative:                 Pt A&Ox1; spoke w/ dtr [name] [phone]); she says pt lives at home w/ her; she plans for pt to return at d/c; family will provide transportation; pt's dtr verified insurance/PCP; she denied SDOH risks; pt has walker, BSC, shower chair, and grab bars in shower; pt has HHPT w/ [facility]; she does not have home oxygen; awaiting PT eval; TOC is following.  Expected Discharge Plan: Home w Home Health Services Barriers to Discharge: Continued Medical Work up   Patient Goals and CMS Choice Patient states their goals for this hospitalization and ongoing recovery are:: per her dtr [name] Shuart-White pt will return home ([name]) CMS Medicare.gov Compare Post Acute Care list provided to:: Patient Represenative (must comment)   [place] ownership interest in [facility].provided to:: Adult Children    Expected Discharge Plan and Services   Discharge Planning Services: CM Consult   Living arrangements for the past 2 months: Single Family Home                                      Prior Living Arrangements/Services Living arrangements for the past 2 months: Single Family Home Lives with:: Adult Children Patient language and need for interpreter reviewed:: Yes Do you feel safe going back to the place where you live?: Yes      Need for Family Participation in Patient Care: Yes (Comment) Care giver support system in place?: Yes (comment) Current home services: DME (walker, BSC, shower chair) Criminal Activity/Legal Involvement Pertinent to Current Situation/Hospitalization: No - Comment as needed  Activities of Daily Living   ADL Screening (condition at time of  admission) Independently performs ADLs?: No Does the patient have a NEW difficulty with bathing/dressing/toileting/self-feeding that is expected to last >3 days?: No Does the patient have a NEW difficulty with getting in/out of bed, walking, or climbing stairs that is expected to last >3 days?: No Does the patient have a NEW difficulty with communication that is expected to last >3 days?: No Is the patient deaf or have difficulty hearing?: No Does the patient have difficulty seeing, even when wearing glasses/contacts?: No Does the patient have difficulty concentrating, remembering, or making decisions?: Yes  Permission Sought/Granted Permission sought to share information with : Case Manager Permission granted to share information with : Yes, Verbal Permission Granted  Share Information with NAME: Case Manager     Permission granted to share info w Relationship: [name] (dtr) [phone]     Emotional Assessment Appearance:: Appears stated age Attitude/Demeanor/Rapport: Unable to Assess Affect (typically observed): Unable to Assess Orientation: :  (unable to assess) Alcohol / Substance Use: Not Applicable Psych Involvement: No (comment)  Admission diagnosis:  Acute deep vein thrombosis (DVT) of left lower extremity (HCC) [I82.402] Patient Active Problem List   Diagnosis Date Noted   Acute deep vein thrombosis (DVT) of left lower extremity (HCC) 02/20/2024   Aortic atherosclerosis (HCC) 02/20/2024   Essential hypertension 02/20/2024   Hypercholesterolemia 02/20/2024   Mild cognitive impairment 02/20/2024   Obstructive sleep apnea 02/20/2024   Stage 3b chronic kidney  disease (HCC) 02/20/2024   Type 2 diabetes mellitus with diabetic chronic kidney disease (HCC) 02/20/2024   Pain due to onychomycosis of toenails of both feet 12/17/2022   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) 11/01/2022   Gastroesophageal reflux disease without esophagitis 03/23/2018    Globus pharyngeus 03/23/2018   Hoarseness 03/23/2018   Hyperkalemia 01/02/2017   PCP:  Lysle Saunas, MD (Inactive) Pharmacy:   [id number] - [place], [place] - [address] AT [facility] [address] [place] [address] Phone: [phone] Fax: [phone]  [id number] - [place], [place] - [address] AT [facility] [address]  [place] [address] Phone: [phone] Fax: [phone]  [id number] - [place], [place] - [address] [place] [address] Phone: [phone] Fax: [phone]     Social Drivers of Health (SDOH) Social History: SDOH Screenings   Food Insecurity: No Food Insecurity (02/21/2024)  Housing: Low Risk  (02/21/2024)  Transportation Needs: No Transportation Needs (02/21/2024)  Utilities: Not At Risk (02/21/2024)  Social Connections: Socially Integrated (02/20/2024)  Tobacco Use: Low Risk  (02/20/2024)  Recent Concern: Tobacco Use - Medium Risk (12/08/2023)   SDOH Interventions: Food Insecurity Interventions: Intervention Not Indicated, Inpatient TOC Housing Interventions: Intervention Not Indicated, Inpatient TOC Transportation Interventions: Intervention Not Indicated, Inpatient TOC Utilities Interventions: Intervention Not Indicated, Inpatient TOC   Readmission Risk Interventions     No data to display

## 2024-02-21 NOTE — Progress Notes (Signed)
  Echocardiogram 2D Echocardiogram has been performed.  Janet Gilbert 02/21/2024, 11:08 AM

## 2024-02-21 NOTE — Care Management Obs Status (Signed)
 MEDICARE OBSERVATION STATUS NOTIFICATION   Patient Details  Name: Janet Gilbert MRN: [id number] Date of Birth: 1931/10/13   Medicare Observation Status Notification Given:  Yes    Levie Ream, RN 02/21/2024, 2:36 PM

## 2024-02-21 NOTE — Progress Notes (Signed)
 PHARMACY - ANTICOAGULATION CONSULT NOTE  Pharmacy Consult for heparin Indication: DVT  Allergies  Allergen Reactions   Contrast Media [Iodinated Contrast Media] Hives and Shortness Of Breath    Hypaque 60%   Sulfa Antibiotics Hives and Shortness Of Breath   Azithromycin Rash   Codeine Nausea Only   Darvon [Propoxyphene] Nausea Only    Patient Measurements: Height: 5\' 2"  (157.5 cm) Weight: 40.8 kg (90 lb) IBW/kg (Calculated) : 50.1 HEPARIN DW (KG): 40.8Recent encounter from January 14, 2024 Weight 96.4 lbs (43.7 kg) Height 63 inches  Vital Signs: Temp: 98.2 F (36.8 C) (06/01 0013) Temp Source: Oral (06/01 0013) BP: 131/56 (06/01 0013) Pulse Rate: 93 (06/01 0013)  Labs: Recent Labs    02/20/24 1126 02/20/24 1258 02/21/24 0118  HGB  --  8.6* 9.0*  HCT  --  27.6* 29.4*  PLT  --  269 331  HEPARINUNFRC  --   --  0.81*  CREATININE 3.12*  --  2.22*    Estimated Creatinine Clearance: 10.8 mL/min (A) (by C-G formula based on SCr of 2.22 mg/dL (H)).   Medical History: Past Medical History:  Diagnosis Date   Arthritis    Asthma    child   Diabetes mellitus without complication (HCC)    Heart murmur    History of hiatal hernia    Hypertension    Peripheral vascular disease (HCC)    left leg  dvt after hysterectomy 47 yrs ago   Pneumonia    grade school   Sleep apnea    pos study lost wt and does not use cpap last test 5-6 yrs ago     Assessment: 89 year old female presented with swelling of left lower extremity. Patient reports hurting leg recently. Patient not eating and drinking as well for a week or so. Ultrasound lower extremities with chronic DVT in right common femoral vein as well as acute DVT on left. No anticoagulation noted PTA. Pharmacy consulted to manage heparin.  02/21/24 Heparin level = 0.81 (supratherapeutic) with heparin gtt @ 700 units/hr Hgb 9, Pltc 331K SCr = 2.2 (trending down) RN reports no bleeding complications   Goal of Therapy:   Heparin level 0.3-0.7 units/ml Monitor platelets by anticoagulation protocol: Yes   Plan:  -Decrease Heparin infusion to 600 units/hr -Check heparin level 8 hours after infusion rate decreased -Daily CBC & heparin level  Jartavious Mckimmy Trefz, PharmD Clinical Pharmacist 02/21/2024 2:20 AM

## 2024-02-21 NOTE — Progress Notes (Signed)
 PROGRESS NOTE  Janet Gilbert  DOB: 1932/08/12  PCP: Lysle Saunas, MD (Inactive) ZOX:[id number]  DOA: 02/20/2024  LOS: 0 days  Hospital Day: 2  Brief narrative: Janet Gilbert is a 89 y.o. female with PMH significant for dementia, DM2, HTN, HLD,, OSA not on CPAP, PAD, left leg DVT 47 years ago after surgery. Patient lives at home with her daughter's family. 3 months ago, she had a fall while in a shopping mall.  She hit her knee.  Per family imaging at that time ruled out any fracture.  She continued to have pain, was seen by PCP and was started on Motrin which she has been taking as needed. Her mobility not limited with the pain.  She has gotten progressively weak and confused.  Appetite and hydration got significantly worse.  She also had several episodes of diarrhea 2 days ago which made her dehydration worse 5/31, with worsening clinical symptoms, family decided to bring her to the ED.  In the ED, patient was afebrile, hemodynamically stable, breathing on room air Initial labs with sodium low at 130, potassium elevated to 5.3, serum bicarb 15, BUN/creatinine elevated to 65/3.12, WC count normal, lactic acid level normal, hemoglobin low at 8.6 Urinalysis with hazy yellow urine with negative leukocytes, negative nitrate, rare bacteria Chest x-ray unremarkable. X-ray left knee showed moderate joint effusion. No acute fracture or dislocation.  Ultrasound duplex of lower extremity showed -Chronic DVT right common femoral vein -Acute DVT of left external iliac vein, left common femoral vein, SF junction, left femoral vein, left proximal profunda vein, left popliteal vein, left posterior tibial vein and left peroneal vein. -Acute intramuscular thrombosis involving the left gastrocnemius veins.   Patient was started on IV hydration, IV heparin drip Admitted to [facility]  Subjective: Patient was seen and examined this morning.  Thin built elderly African-American female with dementia.  Somnolent  mostly, wakes up to smile.  Most of my conversion was with her daughter [name] at bedside.  History reviewed and detailed as above. Remains on heparin drip.  Has not been able to take her breakfast this morning. Chart reviewed Remains hemodynamically stable Labs this morning with sodium 133, potassium 4.3, BUN/creatinine 60/2.22, hemoglobin 9  Assessment and plan: Acute LLE DVT Chronic RLE DVT She injured her left leg 3 months ago which led to progressive immobility, and left> righ leg swelling.t  Ultrasound duplex showed acute LLE DVT up to Ilac vein and chronic RLE DVT Started on heparin drip Pending echocardiogram Plan to switch to DOAC prior to discharge  AKI on CKD 4 Acute metabolic acidosis Baseline creatinine less than 2 but from 2021. Presented with BUN/creatinine elevated to 65/3.12 and bicarb low at 15. Both creatinine and bicarb improving with IV hydration. Continue to monitor  Recent Labs    02/20/24 1126 02/21/24 0118  BUN 65* 60*  CREATININE 3.12* 2.22*  CO2 15* 16*   Acute hyperkalemia Potassium level was elevated to 5.3.  Subsequently improved Recent Labs  Lab 02/20/24 1126 02/21/24 0118  K 5.3* 4.3  MG 2.4  --    Left knee swelling Started after fall 3 months ago.  X-ray without any fracture but showed joint effusion.  Clinically nontender on my touch this morning.  I am not sure if it is part of generalized left lower extremity swelling or osteoarthritis flareup.  Arthrocentesis in the setting of anticoagulation may be more risk than benefit.  Will clinically monitor for now.  If arthrocentesis is needed, will reach out  to orthopedics  Acute on chronic anemia Hemoglobin was slightly low at 2021. Presented with low hemoglobin at 8.6. No clear evidence of GI bleeding..  Daughter denies any prior known history of GI bleeding. Since she has to be started on anticoagulation, she is at risk of drop in hemoglobin.  Will obtain anemia panel to see if she has  any chronic none conspicuous bleeding. Recent Labs    02/20/24 1258 02/21/24 0118  HGB 8.6* 9.0*  MCV 91.4 93.3   GERD Continue PPI,  Hypertension On amlodipine  5 g daily.  I would hold amlodipine  and avoid strict blood pressure control.  Aortic atherosclerosis HLD Continue Lipitor   OSA Does not use CPAP  Dementia with behavioral issues  anxiety/depression Per daughter, she has noticed dementia worsening.  Patient has significant sundowning at home and sometimes also agitation.   PTA meds-Seroquel 50 mg nightly, sertraline 100 mg daily, Remeron 7.5 mg nightly, Ativan 0.5 mg nightly as needed, Tylenol  PM nightly as needed Resume all.  May also need as needed IV Ativan here. Given her progression of dementia, progressive poor oral intake, weakness, palliative care consultation will be helpful   Mobility: Encourage ambulation.  PT eval requested  Goals of care   Code Status: Full Code  Palliative care consulted   DVT prophylaxis: Heparin drip    Antimicrobials: None Fluid: NS at 75 mL/h Consultants: None Family Communication: Daughter [name] at bedside  Status: Observation Level of care:  Telemetry   Patient is from: Home Needs to continue in-hospital care: Needs IV heparin drip, PT eval, IV fluid, monitoring Anticipated d/c to: Pending clinical course   Diet:  Diet Order             Diet heart healthy/carb modified Fluid consistency: Thin  Diet effective now                   Scheduled Meds:  atorvastatin   20 mg Oral Daily   feeding supplement  237 mL Oral BID BM   insulin  aspart  0-9 Units Subcutaneous TID WC   mirtazapine  7.5 mg Oral QHS   pantoprazole  40 mg Oral Daily   QUEtiapine  50 mg Oral QHS   sertraline  100 mg Oral Daily    PRN meds: acetaminophen  **OR** acetaminophen , diphenhydrAMINE  **AND** acetaminophen , LORazepam, ondansetron  **OR** ondansetron  (ZOFRAN ) IV, risperiDONE   Infusions:   sodium chloride      heparin 600  Units/hr (02/21/24 0220)    Antimicrobials: Anti-infectives (From admission, onward)    None       Objective: Vitals:   02/21/24 0013 02/21/24 0248  BP: (!) 131/56 (!) 129/57  Pulse: 93 96  Resp: 18 18  Temp: 98.2 F (36.8 C) 97.8 F (36.6 C)  SpO2: 99% 100%    Intake/Output Summary (Last 24 hours) at 02/21/2024 1121 Last data filed at 02/21/2024 9562 Gross per 24 hour  Intake 1980.97 ml  Output 550 ml  Net 1430.97 ml   Filed Weights   02/20/24 1433  Weight: 40.8 kg   Weight change:  Body mass index is 16.46 kg/m.   Physical Exam: General exam: Pleasant, elderly thin built African-American female Skin: No rashes, lesions or ulcers. HEENT: Atraumatic, normocephalic, no obvious bleeding Lungs: Clear to auscultation bilaterally,  CVS: S1, S2, no murmur,   GI/Abd: Soft, nontender, nondistended, bowel sound present,   CNS: Somnolent mostly, opens eyes and smiles.  Dementia at baseline Psychiatry: Mood appropriate,  Extremities: 1+ left pedal edema, no calf  tenderness.  Left knee swollen as well but no tenderness observed  Data Review: I have personally reviewed the laboratory data and studies available.  F/u labs ordered Unresulted Labs (From admission, onward)     Start     Ordered   02/21/24 1030  Heparin level (unfractionated)  Once-Timed,   TIMED        02/21/24 0222   02/21/24 0847  Vitamin B12  (Anemia Panel (PNL))  Add-on,   AD        02/21/24 0846   02/21/24 0847  Folate  (Anemia Panel (PNL))  Add-on,   AD        02/21/24 0846   02/21/24 0847  Iron and TIBC  (Anemia Panel (PNL))  Add-on,   AD        02/21/24 0846   02/21/24 0847  Ferritin  (Anemia Panel (PNL))  Add-on,   AD        02/21/24 0846   02/21/24 0847  Reticulocytes  (Anemia Panel (PNL))  Add-on,   AD        02/21/24 0846   02/21/24 0500  CBC  Daily,   R      02/20/24 1415   02/21/24 0500  Hemoglobin A1c  Tomorrow morning,   R       Comments: To assess prior glycemic control    02/20/24  1741   02/20/24 1120  CBC with Differential  Once,   STAT        02/20/24 1119            Admission date and time: 02/20/2024 10:30 AM    Signed, Hoyt Macleod, MD [facility] 02/21/2024

## 2024-02-21 NOTE — Progress Notes (Signed)
 PHARMACY - ANTICOAGULATION CONSULT NOTE  Pharmacy Consult for heparin Indication: DVT  Allergies  Allergen Reactions   Contrast Media [Iodinated Contrast Media] Hives and Shortness Of Breath    Hypaque 60%   Sulfa Antibiotics Hives and Shortness Of Breath   Azithromycin Rash   Codeine Nausea Only   Darvon [Propoxyphene] Nausea Only    Patient Measurements: Height: 5\' 2"  (157.5 cm) Weight: 40.8 kg (90 lb) IBW/kg (Calculated) : 50.1 HEPARIN DW (KG): 40.8Recent encounter from January 14, 2024 Weight 96.4 lbs (43.7 kg) Height 63 inches  Vital Signs: Temp: 98.4 F (36.9 C) (06/01 1143) Temp Source: Oral (06/01 1143) BP: 123/52 (06/01 1114) Pulse Rate: 92 (06/01 1143)  Labs: Recent Labs    02/20/24 1126 02/20/24 1258 02/21/24 0118 02/21/24 1006  HGB  --  8.6* 9.0*  --   HCT  --  27.6* 29.4*  --   PLT  --  269 331  --   HEPARINUNFRC  --   --  0.81* 0.54  CREATININE 3.12*  --  2.22*  --     Estimated Creatinine Clearance: 10.8 mL/min (A) (by C-G formula based on SCr of 2.22 mg/dL (H)).   Medical History: Past Medical History:  Diagnosis Date   Arthritis    Asthma    child   Diabetes mellitus without complication (HCC)    Heart murmur    History of hiatal hernia    Hypertension    Peripheral vascular disease (HCC)    left leg  dvt after hysterectomy 47 yrs ago   Pneumonia    grade school   Sleep apnea    pos study lost wt and does not use cpap last test 5-6 yrs ago     Assessment: 89 year old female presented with swelling of left lower extremity. Patient reports hurting leg recently. Patient not eating and drinking as well for a week or so. Ultrasound lower extremities with chronic DVT in right common femoral vein as well as acute DVT on left. No anticoagulation noted PTA. Pharmacy consulted to manage heparin.  02/21/24 01:18 Heparin level = 0.81 (supratherapeutic) with heparin gtt @ 700 units/hr 10:06 Heparin level = 0.54 (therapeutic) with heparin  infusing at 600 units/hr Hgb 9 - up, Pltc 331K SCr = 2.2 (trending down) RN reports no bleeding or other complications of therapy  Goal of Therapy:  Heparin level 0.3-0.7 units/ml Monitor platelets by anticoagulation protocol: Yes   Plan:  -Continue IV Heparin infusion at 600 units/hr -Check 8 hr confirmatory heparin level -Monitor daily heparin level, CBC, signs/symptoms of bleeding   Delpha Fickle, PharmD, BCPS Clinical Pharmacist 02/21/2024 12:05 PM

## 2024-02-22 ENCOUNTER — Other Ambulatory Visit (HOSPITAL_COMMUNITY): Payer: Self-pay

## 2024-02-22 ENCOUNTER — Telehealth (HOSPITAL_COMMUNITY): Payer: Self-pay | Admitting: Pharmacy Technician

## 2024-02-22 DIAGNOSIS — I824Y2 Acute embolism and thrombosis of unspecified deep veins of left proximal lower extremity: Secondary | ICD-10-CM | POA: Diagnosis not present

## 2024-02-22 DIAGNOSIS — R531 Weakness: Secondary | ICD-10-CM

## 2024-02-22 DIAGNOSIS — Z515 Encounter for palliative care: Secondary | ICD-10-CM

## 2024-02-22 DIAGNOSIS — Z7189 Other specified counseling: Secondary | ICD-10-CM

## 2024-02-22 LAB — GLUCOSE, CAPILLARY
Glucose-Capillary: 147 mg/dL — ABNORMAL HIGH (ref 70–99)
Glucose-Capillary: 199 mg/dL — ABNORMAL HIGH (ref 70–99)
Glucose-Capillary: 148 mg/dL — ABNORMAL HIGH (ref 70–99)
Glucose-Capillary: 103 mg/dL — ABNORMAL HIGH (ref 70–99)

## 2024-02-22 LAB — CBC
HCT: 25.9 % — ABNORMAL LOW (ref 36.0–46.0)
Hemoglobin: 8.2 g/dL — ABNORMAL LOW (ref 12.0–15.0)
MCH: 28.5 pg (ref 26.0–34.0)
MCHC: 31.7 g/dL (ref 30.0–36.0)
MCV: 89.9 fL (ref 80.0–100.0)
Platelets: 326 10*3/uL (ref 150–400)
RBC: 2.88 MIL/uL — ABNORMAL LOW (ref 3.87–5.11)
RDW: 15.3 % (ref 11.5–15.5)
WBC: 9.3 10*3/uL (ref 4.0–10.5)
nRBC: 0 % (ref 0.0–0.2)

## 2024-02-22 LAB — BASIC METABOLIC PANEL WITH GFR
Anion gap: 3 — ABNORMAL LOW (ref 5–15)
BUN: 56 mg/dL — ABNORMAL HIGH (ref 8–23)
CO2: 17 mmol/L — ABNORMAL LOW (ref 22–32)
Calcium: 7.6 mg/dL — ABNORMAL LOW (ref 8.9–10.3)
Chloride: 109 mmol/L (ref 98–111)
Creatinine, Ser: 1.57 mg/dL — ABNORMAL HIGH (ref 0.44–1.00)
GFR, Estimated: 31 mL/min — ABNORMAL LOW (ref >60)
Glucose, Bld: 145 mg/dL — ABNORMAL HIGH (ref 70–99)
Potassium: 4.5 mmol/L (ref 3.5–5.1)
Sodium: 129 mmol/L — ABNORMAL LOW (ref 135–145)

## 2024-02-22 LAB — HEPARIN LEVEL (UNFRACTIONATED): Heparin Unfractionated: 0.4 [IU]/mL (ref 0.30–0.70)

## 2024-02-22 MED ORDER — OXYCODONE HCL 5 MG PO TABS
5.0000 mg | ORAL_TABLET | Freq: Four times a day (QID) | ORAL | Status: DC | PRN
  Administered 2024-02-22 – 2024-02-25 (×5): 5 mg via ORAL
  Filled 2024-02-22 (×6): qty 1

## 2024-02-22 MED ORDER — ACETAMINOPHEN 500 MG PO TABS
1000.0000 mg | ORAL_TABLET | Freq: Three times a day (TID) | ORAL | Status: DC
  Administered 2024-02-22 – 2024-02-26 (×12): 1000 mg via ORAL
  Filled 2024-02-22 (×12): qty 2

## 2024-02-22 MED ORDER — SODIUM CHLORIDE 0.9 % IV SOLN: INTRAVENOUS | Status: DC

## 2024-02-22 NOTE — Telephone Encounter (Signed)
 Patient Product/process development scientist completed.    The patient is insured through [facility]. Patient has Medicare and is not eligible for a copay card, but may be able to apply for patient assistance or Medicare RX Payment Plan (Patient Must reach out to their plan, if eligible for payment plan), if available.    Ran test claim for Eliquis 5 mg and the current 30 day co-pay is $467.00 due to a $420.00 deductible.  Will be $47.00 once deductible is met.   This test claim was processed through [facility] [facility]- copay amounts may vary at other pharmacies due to pharmacy/plan contracts, or as the patient moves through the different stages of their insurance plan.     Roland Earl, CPHT Pharmacy Technician III Certified Patient Advocate [facility] Patient Advocate Team Direct Number: [phone]  Fax: [phone]

## 2024-02-22 NOTE — Progress Notes (Signed)
 PHARMACY - ANTICOAGULATION CONSULT NOTE  Pharmacy Consult for heparin Indication: DVT  Allergies  Allergen Reactions   Contrast Media [Iodinated Contrast Media] Hives and Shortness Of Breath    Hypaque 60%   Sulfa Antibiotics Hives and Shortness Of Breath   Azithromycin Rash   Codeine Nausea Only   Darvon [Propoxyphene] Nausea Only    Patient Measurements: Height: 5\' 2"  (157.5 cm) Weight: 40.8 kg (90 lb) IBW/kg (Calculated) : 50.1 HEPARIN DW (KG): 40.8  Vital Signs: Temp: 98.4 F (36.9 C) (06/02 0315) Temp Source: Axillary (06/02 0315) BP: 144/50 (06/02 0315) Pulse Rate: 100 (06/02 0315)  Labs: Recent Labs    02/20/24 1126 02/20/24 1258 02/20/24 1258 02/21/24 0118 02/21/24 1006 02/21/24 1822 02/22/24 0420  HGB  --  8.6*   < > 9.0*  --   --  8.2*  HCT  --  27.6*  --  29.4*  --   --  25.9*  PLT  --  269  --  331  --   --  326  HEPARINUNFRC  --   --    < > 0.81* 0.54 0.30 0.40  CREATININE 3.12*  --   --  2.22*  --   --   --    < > = values in this interval not displayed.    Estimated Creatinine Clearance: 10.8 mL/min (A) (by C-G formula based on SCr of 2.22 mg/dL (H)).   Medical History: Past Medical History:  Diagnosis Date   Arthritis    Asthma    child   Diabetes mellitus without complication (HCC)    Heart murmur    History of hiatal hernia    Hypertension    Peripheral vascular disease (HCC)    left leg  dvt after hysterectomy 47 yrs ago   Pneumonia    grade school   Sleep apnea    pos study lost wt and does not use cpap last test 5-6 yrs ago     Assessment: 89 year old female presented with swelling of left lower extremity. Patient reports hurting leg recently. Patient not eating and drinking as well for a week or so. Ultrasound lower extremities with chronic DVT in right common femoral vein as well as acute DVT on left. No anticoagulation noted PTA. Pharmacy consulted to manage heparin.  02/22/24 Heparin level = 0.4 is therapeutic on heparin  infusion of 650 units/hr Hgb low, Plt WNL No bleeding reported  Goal of Therapy:  Heparin level 0.3-0.7 units/ml Monitor platelets by anticoagulation protocol: Yes   Plan:  Continue heparin infusion at current rate of 650 units/hr CBC, heparin level daily Monitor for signs of bleeding  Shireen Dory, PharmD 02/22/24 7:33 AM

## 2024-02-22 NOTE — Progress Notes (Addendum)
 PROGRESS NOTE  Janet Gilbert  DOB: November 15, 1931  PCP: Lysle Saunas, MD (Inactive) ZOX:[id number]  DOA: 02/20/2024  LOS: 1 day  Hospital Day: 3  Brief narrative: Janet Gilbert is a 89 y.o. female with PMH significant for dementia, DM2, HTN, HLD,, OSA not on CPAP, PAD, left leg DVT 47 years ago after surgery. Patient lives at home with her daughter's family. 3 months ago, she had a fall while in a shopping mall.  She hit her knee.  Per family imaging at that time ruled out any fracture.  She continued to have pain, was seen by PCP and was started on Motrin which she has been taking as needed. Her mobility not limited with the pain.  She has gotten progressively weak and confused.  Appetite and hydration got significantly worse.  She also had several episodes of diarrhea 2 days ago which made her dehydration worse 5/31, with worsening clinical symptoms, family decided to bring her to the ED.  In the ED, patient was afebrile, hemodynamically stable, breathing on room air Initial labs with sodium low at 130, potassium elevated to 5.3, serum bicarb 15, BUN/creatinine elevated to 65/3.12, WC count normal, lactic acid level normal, hemoglobin low at 8.6 Urinalysis with hazy yellow urine with negative leukocytes, negative nitrate, rare bacteria Chest x-ray unremarkable. X-ray left knee showed moderate joint effusion. No acute fracture or dislocation.  Ultrasound duplex of lower extremity showed -Chronic DVT right common femoral vein -Acute DVT of left external iliac vein, left common femoral vein, SF junction, left femoral vein, left proximal profunda vein, left popliteal vein, left posterior tibial vein and left peroneal vein. -Acute intramuscular thrombosis involving the left gastrocnemius veins.   Patient was started on IV hydration, IV heparin drip Admitted to [facility]  Subjective: Patient was seen and examined this morning.   Lying on bed.  Alert, awake, able to verbalize her name, oriented to  place.  Family not at bedside today   Assessment and plan: Acute LLE DVT Chronic RLE DVT She injured her left leg 3 months ago which led to progressive immobility, and left> righ leg swelling.t  Ultrasound duplex showed acute LLE DVT up to Ilac vein and chronic RLE DVT Started on heparin drip Echo with LV EF 70 to 75%, grade 1 DD, right ventricle size and function normal, Plan to switch to DOAC prior to discharge.  However seems to have high co-pay to Eliquis on Xarelto before deductible is met  AKI on CKD 4 Acute metabolic acidosis Baseline creatinine less than 2 but from 2021. Presented with BUN/creatinine elevated to 65/3.12 and bicarb low at 15. Both creatinine and bicarb improving with IV hydration.  Creatinine 1.57, bicarb 17 today Continue to monitor  Recent Labs    02/20/24 1126 02/21/24 0118 02/22/24 0417  BUN 65* 60* 56*  CREATININE 3.12* 2.22* 1.57*  CO2 15* 16* 17*   Acute hyperkalemia Potassium level was elevated to 5.3.  Subsequently improved Recent Labs  Lab 02/20/24 1126 02/21/24 0118 02/22/24 0417  K 5.3* 4.3 4.5  MG 2.4  --   --    Left knee swelling Started after fall 3 months ago.  X-ray without any fracture but showed joint effusion.  Clinically nontender on my touch this morning.  I am not sure if it is part of generalized left lower extremity swelling or osteoarthritis flareup.  Per physical therapy, patient was really limited on her mobility today because of left knee pain.  I will request orthopedics on-call for  any risk versus benefit of arthrocentesis.   Acute on chronic anemia Hemoglobin was slightly low at 2021. Presented with low hemoglobin at 8.6. No clear evidence of GI bleeding..  Daughter denies any prior known history of GI bleeding. Since she has to be started on anticoagulation, she is at risk of drop in hemoglobin.  Will obtain anemia panel to see if she has any chronic none conspicuous bleeding. Recent Labs    02/20/24 1258  02/21/24 0118 02/21/24 1006 02/22/24 0420  HGB 8.6* 9.0*  --  8.2*  MCV 91.4 93.3  --  89.9  VITAMINB12  --   --  745  --   FOLATE  --   --  16.5  --   FERRITIN  --   --  253  --   TIBC  --   --  174*  --   IRON  --   --  7*  --    GERD Continue PPI,  Hypertension PTA on amlodipine .  Currently on hold  Aortic atherosclerosis HLD Continue Lipitor   OSA Does not use CPAP  Dementia with behavioral issues  anxiety/depression Per daughter, she has noticed dementia worsening.  Patient has significant sundowning at home and sometimes also agitation.   PTA meds-Seroquel 50 mg nightly, sertraline 100 mg daily, Remeron 7.5 mg nightly, Ativan 0.5 mg nightly as needed, Tylenol  PM nightly as needed Currently continued on all.  May also need as needed IV Ativan here. Given her progression of dementia, progressive poor oral intake, weakness, palliative care consultation will be helpful   Mobility: Encourage ambulation.  PT eval pending  Goals of care   Code Status: Full Code  Palliative care consulted   DVT prophylaxis: Heparin drip    Antimicrobials: None Fluid: Has poor oral intake.  Continue hydration with NS at 50 mL/h Consultants: None Family Communication: Daughter [name] at bedside  Status: inpatient Level of care:  Telemetry   Patient is from: Home Needs to continue in-hospital care: Needs IV heparin drip, PT eval, IV fluid, monitoring Anticipated d/c to: Pending clinical course   Diet:  Diet Order             Diet heart healthy/carb modified Fluid consistency: Thin  Diet effective now                   Scheduled Meds:  atorvastatin   20 mg Oral Daily   feeding supplement  237 mL Oral BID BM   insulin  aspart  0-9 Units Subcutaneous TID WC   mirtazapine  7.5 mg Oral QHS   pantoprazole  40 mg Oral Daily   QUEtiapine  50 mg Oral QHS   sertraline  100 mg Oral Daily    PRN meds: acetaminophen  **OR** acetaminophen , diphenhydrAMINE  **AND**  acetaminophen , LORazepam, ondansetron  **OR** ondansetron  (ZOFRAN ) IV, risperiDONE   Infusions:   sodium chloride      heparin 650 Units/hr (02/22/24 0236)    Antimicrobials: Anti-infectives (From admission, onward)    None       Objective: Vitals:   02/22/24 0315 02/22/24 1330  BP: (!) 144/50 (!) 144/59  Pulse: 100 92  Resp: 18 18  Temp: 98.4 F (36.9 C) 98.4 F (36.9 C)  SpO2: 100% 100%    Intake/Output Summary (Last 24 hours) at 02/22/2024 1405 Last data filed at 02/22/2024 0900 Gross per 24 hour  Intake 1570.96 ml  Output 600 ml  Net 970.96 ml   Filed Weights   02/20/24 1433  Weight: 40.8 kg  Weight change:  Body mass index is 16.46 kg/m.   Physical Exam: General exam: Pleasant, elderly thin built African-American female.  Not in pain at the time of my eval Skin: No rashes, lesions or ulcers. HEENT: Atraumatic, normocephalic, no obvious bleeding Lungs: Clear to auscultation bilaterally,  CVS: S1, S2, no murmur,   GI/Abd: Soft, nontender, nondistended, bowel sound present,   CNS: Alert, awake, oriented to place.  Demented at baseline.   Psychiatry: Mood appropriate,  Extremities: Improving left pedal edema, no calf tenderness.  Left knee slightly as well but no tenderness observed  Data Review: I have personally reviewed the laboratory data and studies available.  F/u labs ordered Unresulted Labs (From admission, onward)     Start     Ordered   02/23/24 0500  Basic metabolic panel with GFR  Daily,   R      02/22/24 0758   02/22/24 0500  Heparin level (unfractionated)  Daily,   R      02/21/24 1908   02/21/24 0847  Reticulocytes  (Anemia Panel (PNL))  Add-on,   AD        02/21/24 0846   02/21/24 0500  CBC  Daily,   R      02/20/24 1415   02/21/24 0500  Hemoglobin A1c  Tomorrow morning,   R       Comments: To assess prior glycemic control    02/20/24 1741   02/20/24 1120  CBC with Differential  Once,   STAT        02/20/24 1119             Signed, Hoyt Macleod, MD [facility] 02/22/2024

## 2024-02-22 NOTE — Consult Note (Signed)
 Orthopedic Consultation Note  Current Hospital Day : Hospital Day: 3  Reason For Consult: Left knee pain and effusion  History of Present Illness:  Janet Gilbert is a 89 y.o. female who is presently admitted to the hospital treated for blood clots in her bilateral lower EXTR as well as CKD.  She has been having ongoing knee pain with a small effusion and orthopedics was consulted for consideration of aspiration.  She is unable to provide a meaningful history although her son-in-law is also at bedside  Past Medical History:  Diagnosis Date   Arthritis    Asthma    child   Diabetes mellitus without complication (HCC)    Heart murmur    History of hiatal hernia    Hypertension    Peripheral vascular disease (HCC)    left leg  dvt after hysterectomy 47 yrs ago   Pneumonia    grade school   Sleep apnea    pos study lost wt and does not use cpap last test 5-6 yrs ago    Past Surgical History:  Procedure Laterality Date   ABDOMINAL HYSTERECTOMY  1947   ELBOW SURGERY Right 90's   nerves   REVERSE SHOULDER ARTHROPLASTY Right 01/01/2017   Procedure: REVERSE SHOULDER ARTHROPLASTY;  Surgeon: Sammye Cristal, MD;  Location: [facility] OR;  Service: Orthopedics;  Laterality: Right;  RIGHT REVERSE SHOULDER ARTHROPLASTY   REVERSE TOTAL SHOULDER ARTHROPLASTY Right 01/01/2017    Prior to Admission medications   Medication Sig Start Date End Date Taking? Authorizing Provider  acetaminophen  (TYLENOL ) 650 MG CR tablet Take 1,300 mg by mouth daily as needed for pain.   Yes [provider]  amLODipine  (NORVASC ) 5 MG tablet Take 5 mg by mouth daily.   Yes [provider]  atorvastatin  (LIPITOR) 20 MG tablet Take 20 mg by mouth daily.   Yes [provider]  diphenhydramine -acetaminophen  (TYLENOL  PM) 25-500 MG TABS tablet Take 2 tablets by mouth at bedtime as needed (sleep/pain).   Yes [provider]  LORazepam (ATIVAN) 0.5 MG tablet Take 0.5 mg by mouth at bedtime as  needed for anxiety. 02/02/24  Yes [provider]  mirtazapine (REMERON) 7.5 MG tablet Take 7.5 mg by mouth at bedtime. 02/14/24  Yes [provider]  pantoprazole (PROTONIX) 40 MG tablet Take 40 mg by mouth daily. 02/02/24  Yes [provider]  QUEtiapine (SEROQUEL) 50 MG tablet Take 50 mg by mouth at bedtime.   Yes [provider]  sertraline (ZOLOFT) 100 MG tablet Take 100 mg by mouth daily. 01/22/24  Yes [provider]  trolamine salicylate (ASPERCREME) 10 % cream Apply 1 Application topically as needed for muscle pain.   Yes [provider]  metFORMIN  (GLUCOPHAGE ) 1000 MG tablet Take 500-1,000 mg by mouth 2 (two) times daily with a meal. Takes 1000 mg in the morning and 500mg  in the evening Patient not taking: Reported on 02/21/2024    [provider]    Physical Examination Left Lower Extremity: 1+ effusion in the knee No pain with short arc range of motion  + ankle dorsiflexion/plantarflexion/EHL SILT SP/DP/T Foot wwp   Imaging: X-rays of the left knee reviewed interpreted demonstrating moderate degenerative changes, mild effusion.  Assessment:   Janet Gilbert is a 89 y.o. female with left knee pain and effusion.  I discussed with her and her son-in-law as well as possible send I would not recommend an aspiration or injection while she is admitted as an inpatient and currently  on a heparin drip.  There is a high chance of a recurrent hemarthrosis in this setting and possibly infeciton. Happy to see her an outpatient for further consideration of steroid injection to the manage her knee pain.

## 2024-02-22 NOTE — Consult Note (Signed)
 Consultation Note Date: 02/22/2024   Patient Name: Janet Gilbert  DOB: 01/25/1932  MRN: [id number]  Age / Sex: 89 y.o., female  PCP: Janet Saunas, MD (Inactive) Referring Physician: Hoyt Macleod, MD  Reason for Consultation: Establishing goals of care  HPI/Patient Profile: 89 y.o. female admitted on 02/20/2024  Janet Gilbert is a 89 y.o. female with PMH significant for dementia, DM2, HTN, HLD,, OSA not on CPAP, PAD, left leg DVT 47 years ago after surgery. Patient lives at home with her daughter's family. 3 months ago, she had a fall while in a shopping mall.  She hit her knee.  Per family imaging at that time ruled out any fracture.  She continued to have pain, was seen by PCP and was started on Motrin which she has been taking as needed. Her mobility not limited with the pain.  She has gotten progressively weak and confused.  Appetite and hydration got significantly worse.  She also had several episodes of diarrhea 2 days ago which made her dehydration worse 5/31, with worsening clinical symptoms, family decided to bring her to the ED. Clinical Assessment and Goals of Care: Patient acute left lower extremity DVT, chronic right lower extremity DVT, stage IV CKD acute kidney injury    Palliative care consulted for goals of care discussions.  The patient has dementia, lives at home with her daughter.  Has had a functional status decline, has had more of a cognitive decline has been having significant sundowning at home.  She has been on Seroquel sertraline Remeron as well as Ativan on an as-needed basis.  Palliative medicine is specialized medical care for people living with serious illness. It focuses on providing relief from the symptoms and stress of a serious illness. The goal is to improve quality of life for both the patient and the family.  Goals of care: Broad aims of medical therapy in relation to the  patient's values and preferences. Our aim is to provide medical care aimed at enabling patients to achieve the goals that matter most to them, given the circumstances of their particular medical situation and their constraints.    HCPOA  Daughter Janet Gilbert.   SUMMARY OF RECOMMENDATIONS   Goals of care discussions undertaken with the patient's daughter Janet Gilbert who is present at the bedside.  She states the POA, she is the patient's primary caregiver patient lives with her.  Discussed about decline trajectory from dementia standpoint.  Discussed about scope of current hospitalization.  Offered recommendation for establishing DNR/DNI and for having home with palliative services upon discharge, suspect that the patient will need hospice support in the near future going forward.  Palliative services will follow up again on 6 - 3 - 25. Thank you for the consult.  Code Status/Advance Care Planning: Full code - daughter HCPOA considering DNR DNI, gave her a blank MOST form, palliative will follow up on 02-23-24, have recommended DNR DNI.   Symptom Management:   Continue current mode of care.   Palliative Prophylaxis:  Delirium Protocol  Additional Recommendations (Limitations, Scope, Preferences): Full Scope Treatment  Psycho-social/Spiritual:  Desire for further Chaplaincy support:yes Additional Recommendations: Education on Hospice  Prognosis:  < 12 months  Discharge Planning: Home with Palliative Services      Primary Diagnoses: Present on Admission:  Hyperkalemia  Gastroesophageal reflux disease without esophagitis  Acute deep vein thrombosis (DVT) of left lower extremity (HCC)  Stage 3b chronic kidney disease (HCC)  Hypercholesterolemia  Essential hypertension  Aortic atherosclerosis (HCC)  Obstructive sleep apnea  Acute deep vein thrombosis (DVT) (HCC)   I have reviewed the medical record, interviewed the patient and family, and examined the patient. The following aspects  are pertinent.  Past Medical History:  Diagnosis Date   Arthritis    Asthma    child   Diabetes mellitus without complication (HCC)    Heart murmur    History of hiatal hernia    Hypertension    Peripheral vascular disease (HCC)    left leg  dvt after hysterectomy 47 yrs ago   Pneumonia    grade school   Sleep apnea    pos study lost wt and does not use cpap last test 5-6 yrs ago   Social History   Socioeconomic History   Marital status: Married    Spouse name: Not on file   Number of children: 2   Years of education: Not on file   Highest education level: Not on file  Occupational History   Not on file  Tobacco Use   Smoking status: Never   Smokeless tobacco: Never   Tobacco comments:     1/2  pack a day   Vaping Use   Vaping status: Never Used  Substance and Sexual Activity   Alcohol use: Not Currently    Comment: 2 more a day if stressed   Drug use: Never   Sexual activity: Not Currently  Other Topics Concern   Not on file  Social History Narrative   Are you right handed or left handed? right   Are you currently employed ?    What is your current occupation?retired   Do you live at home alone?   Who lives with you? daughter   What type of home do you live in: 1 story or 2 story? one    Caffeine 1/2 coffee   Social Drivers of Corporate investment banker Strain: Not on file  Food Insecurity: No Food Insecurity (02/21/2024)   Hunger Vital Sign    Worried About Running Out of Food in the Last Year: Never true    Ran Out of Food in the Last Year: Never true  Transportation Needs: No Transportation Needs (02/21/2024)   PRAPARE - Administrator, Civil Service (Medical): No    Lack of Transportation (Non-Medical): No  Physical Activity: Not on file  Stress: Not on file  Social Connections: Socially Integrated (02/20/2024)   Social Connection and Isolation Panel [NHANES]    Frequency of Communication with Friends and Family: More than three times a  week    Frequency of Social Gatherings with Friends and Family: More than three times a week    Attends Religious Services: 1 to 4 times per year    Active Member of [employer] or Organizations: No    Attends Engineer, structural: 1 to 4 times per year    Marital Status: Married   Family History  Problem Relation Age of Onset   High blood pressure Daughter    Asthma Daughter  Scheduled Meds:  acetaminophen   1,000 mg Oral TID   atorvastatin   20 mg Oral Daily   feeding supplement  237 mL Oral BID BM   insulin  aspart  0-9 Units Subcutaneous TID WC   mirtazapine  7.5 mg Oral QHS   pantoprazole  40 mg Oral Daily   QUEtiapine  50 mg Oral QHS   sertraline  100 mg Oral Daily   Continuous Infusions:  sodium chloride  50 mL/hr at 02/22/24 1419   heparin 650 Units/hr (02/22/24 0236)   PRN Meds:.diphenhydrAMINE  **AND** [DISCONTINUED] acetaminophen , LORazepam, ondansetron  **OR** ondansetron  (ZOFRAN ) IV, oxyCODONE , risperiDONE Medications Prior to Admission:  Prior to Admission medications   Medication Sig Start Date End Date Taking? Authorizing Provider  acetaminophen  (TYLENOL ) 650 MG CR tablet Take 1,300 mg by mouth daily as needed for pain.   Yes [provider]  amLODipine  (NORVASC ) 5 MG tablet Take 5 mg by mouth daily.   Yes [provider]  atorvastatin  (LIPITOR) 20 MG tablet Take 20 mg by mouth daily.   Yes [provider]  diphenhydramine -acetaminophen  (TYLENOL  PM) 25-500 MG TABS tablet Take 2 tablets by mouth at bedtime as needed (sleep/pain).   Yes [provider]  LORazepam (ATIVAN) 0.5 MG tablet Take 0.5 mg by mouth at bedtime as needed for anxiety. 02/02/24  Yes [provider]  mirtazapine (REMERON) 7.5 MG tablet Take 7.5 mg by mouth at bedtime. 02/14/24  Yes [provider]  pantoprazole (PROTONIX) 40 MG tablet Take 40 mg by mouth daily. 02/02/24  Yes [provider]  QUEtiapine (SEROQUEL) 50 MG tablet Take 50 mg  by mouth at bedtime.   Yes [provider]  sertraline (ZOLOFT) 100 MG tablet Take 100 mg by mouth daily. 01/22/24  Yes [provider]  trolamine salicylate (ASPERCREME) 10 % cream Apply 1 Application topically as needed for muscle pain.   Yes [provider]  metFORMIN  (GLUCOPHAGE ) 1000 MG tablet Take 500-1,000 mg by mouth 2 (two) times daily with a meal. Takes 1000 mg in the morning and 500mg  in the evening Patient not taking: Reported on 02/21/2024    [provider]   Allergies  Allergen Reactions   Contrast Media [Iodinated Contrast Media] Hives and Shortness Of Breath    Hypaque 60%   Sulfa Antibiotics Hives and Shortness Of Breath   Azithromycin Rash   Codeine Nausea Only   Darvon [Propoxyphene] Nausea Only   Review of Systems +weakness + sundowning at times.   Physical Exam Awake alert Resting in chair No acute distress Baseline dementia Left lower extremity edema  Vital Signs: BP (!) 144/59 (BP Location: Left Arm)   Pulse 92   Temp 98.4 F (36.9 C)   Resp 18   Ht 5\' 2"  (1.575 m)   Wt 40.8 kg   SpO2 100%   BMI 16.46 kg/m  Pain Scale: 0-10   Pain Score: 5    SpO2: SpO2: 100 % O2 Device:SpO2: 100 % O2 Flow Rate: .   IO: Intake/output summary:  Intake/Output Summary (Last 24 hours) at 02/22/2024 1510 Last data filed at 02/22/2024 0900 Gross per 24 hour  Intake 1570.96 ml  Output 600 ml  Net 970.96 ml    LBM: Last BM Date : 02/21/24 Baseline Weight: Weight: 40.8 kg Most recent weight: Weight: 40.8 kg     Palliative Assessment/Data:   PPS 50%  Time In:  1400 Time Out:    1500 Time Total:  60  Greater than 50%  of this  time was spent counseling and coordinating care related to the above assessment and plan.  Signed by: Lujean Sake, MD   Please contact Palliative Medicine Team phone at [phone] for questions and concerns.  For individual provider: See Tilford Foley

## 2024-02-22 NOTE — Evaluation (Signed)
 Physical Therapy Evaluation Patient Details Name: Janet Gilbert MRN: [id number] DOB: 03/14/1932 Today's Date: 02/22/2024  History of Present Illness  89 yo female presents to therapy following hospital admission on 02/20/2024 due to progression weakness and confusion with poor PO intake and several episodes of loose bowels. Pt sustained fall 3 months ago with residual LE pain and edema pt was found to have multiple L LE DVTs and L intramuscular thrombosis of gastroc in addition to AKI on CKD IV, acute metabolic acidosis, hyperkalemia, and anemia. Pt PMH includes but is not limited to: chronic R femoral DVT, dementia, OSA, PVD, GERD, OSA, DM II, HTN, and R reverse TSA.  Clinical Impression      Pt admitted with above diagnosis.  Pt currently with functional limitations due to the deficits listed below (see PT Problem List). Pt in bed resting when PT arrived. Daughter present and able to provide insight to PLOF. Pt roused and agreeable to participation. Pt behavior fluctuated throughout PT eval, pt required increased time for all motor processing and planning with multimodal cues and extensive assist. Pt has dementia, fear of falling and compounded by L LE pain associated with fall and DVTs. Daughter is aware of required physical assist and reports her spouse does the lifting if required and at time of d/c want to have pt return home and resume [facility] services. Pt required max A for supine to sit, max/total A for sit to stand  from EOB trial with RW and then "bear hug" pt required max/total A with nurse tech assisting with hand placement to safely complete SPT bed to recliner no AD. Pt left in recliner reporting she was falling, orientation to location provided, daughter and all needs in place.  Pt will benefit from acute skilled PT to increase their independence and safety with mobility to allow discharge.       If plan is discharge home, recommend the following: Two people to help with walking and/or  transfers;Two people to help with bathing/dressing/bathroom;Assistance with cooking/housework;Direct supervision/assist for financial management;Direct supervision/assist for medications management;Assist for transportation;Help with stairs or ramp for entrance;Supervision due to cognitive status   Can travel by private vehicle        Equipment Recommendations None recommended by PT  Recommendations for Other Services       Functional Status Assessment Patient has had a recent decline in their functional status and demonstrates the ability to make significant improvements in function in a reasonable and predictable amount of time.     Precautions / Restrictions Precautions Precautions: Fall Restrictions Weight Bearing Restrictions Per Provider Order: No      Mobility  Bed Mobility Overal bed mobility: Needs Assistance Bed Mobility: Supine to Sit     Supine to sit: Max assist, HOB elevated     General bed mobility comments: minimal intiation of movement to participate with therapy and expressed fear of falling    Transfers Overall transfer level: Needs assistance Equipment used: Rolling walker (2 wheels), None Transfers: Sit to/from Stand, Bed to chair/wheelchair/BSC Sit to Stand: Max assist Stand pivot transfers: Max assist, +2 physical assistance, +2 safety/equipment         General transfer comment: trail for sit to stand from EOB with use of Rw and pt exhibited strong posterior pusing responce and once in upright standing assumed "pike" like position with trunk flexion and B knee extension with B LEs sliding anterirorly, trial with sit to stand and SPT no AD and pt exhibiting posture as above and  reaching for recliner, NT present to assist with hygiene as pt was found to have bowel movement and nurse tech assist to safely complete SPT to recliner, pt indicated that she was falling even once seated in recliner.    Ambulation/Gait               General Gait  Details: NT  Stairs            Wheelchair Mobility     Tilt Bed    Modified Rankin (Stroke Patients Only)       Balance Overall balance assessment: Needs assistance, History of Falls Sitting-balance support: Feet supported Sitting balance-Leahy Scale: Poor     Standing balance support: Bilateral upper extremity supported, During functional activity Standing balance-Leahy Scale: Zero Standing balance comment: pt requried max/total A to maintain standing balance                             Pertinent Vitals/Pain Pain Assessment Pain Assessment: Faces Faces Pain Scale: Hurts whole lot Pain Location: L LE Pain Descriptors / Indicators: Constant, Discomfort, Crying, Moaning Pain Intervention(s): Monitored during session, Limited activity within patient's tolerance, Repositioned    Home Living Family/patient expects to be discharged to:: Private residence Living Arrangements: Spouse/significant other;Children Available Help at Discharge: Family Type of Home: House Home Access: Stairs to enter Entrance Stairs-Rails: Left Entrance Stairs-Number of Steps: 4   Home Layout: Two level;Able to live on main level with bedroom/bathroom Home Equipment: Rolling Walker (2 wheels) Additional Comments: pt is a poor historian and daughter present and able to provide insight to PLOF, pt live with daugter and son in law as well as spouse in a home as above. pt was [facility] with [facility] services following fall and amb with RW, duaghter indicates pt occationally has moments when she is intrinsically motivated and will amb no AD or S however more spontaneous    Prior Function Prior Level of Function : Needs assist  Cognitive Assist : Mobility (cognitive);ADLs (cognitive) Mobility (Cognitive): Intermittent cues ADLs (Cognitive): Step by step cues Physical Assist : Mobility (physical);ADLs (physical) Mobility (physical): Transfers;Gait;Stairs ADLs (physical):  Bathing;Dressing;Toileting;IADLs Mobility Comments: pt requires A and use of RW for household mobility ADLs Comments: A for all bathing and dressing tasks     Extremity/Trunk Assessment        Lower Extremity Assessment Lower Extremity Assessment: Generalized weakness (L > R  and limtied due to pain)    Cervical / Trunk Assessment Cervical / Trunk Assessment: Kyphotic  Communication   Communication Communication: Impaired Factors Affecting Communication: Difficulty expressing self    Cognition Arousal: Alert (once arroused) Behavior During Therapy: Flat affect, Agitated, Anxious (pt behavior fluctuated during the course of eval)   PT - Cognitive impairments: History of cognitive impairments, Memory, Attention, Orientation, Awareness, Safety/Judgement, Initiation   Orientation impairments: Person                     Following commands: Impaired Following commands impaired: Follows one step commands inconsistently, Follows one step commands with increased time     Cueing Cueing Techniques: Verbal cues, Gestural cues, Tactile cues, Visual cues     General Comments      Exercises     Assessment/Plan    PT Assessment Patient needs continued PT services  PT Problem List Decreased strength;Decreased range of motion;Decreased activity tolerance;Decreased balance;Decreased mobility;Decreased coordination;Decreased cognition;Decreased knowledge of use of DME;Decreased safety awareness;Decreased knowledge of precautions;Pain  PT Treatment Interventions DME instruction;Gait training;Stair training;Functional mobility training;Therapeutic activities;Therapeutic exercise;Balance training;Neuromuscular re-education;Patient/family education    PT Goals (Current goals can be found in the Care Plan section)  Acute Rehab PT Goals PT Goal Formulation: Patient unable to participate in goal setting    Frequency Min 2X/week     Co-evaluation                AM-PAC PT "6 Clicks" Mobility  Outcome Measure Help needed turning from your back to your side while in a flat bed without using bedrails?: A Lot Help needed moving from lying on your back to sitting on the side of a flat bed without using bedrails?: A Lot Help needed moving to and from a bed to a chair (including a wheelchair)?: A Lot Help needed standing up from a chair using your arms (e.g., wheelchair or bedside chair)?: A Lot Help needed to walk in hospital room?: Total Help needed climbing 3-5 steps with a railing? : Total 6 Click Score: 10    End of Session Equipment Utilized During Treatment: Gait belt Activity Tolerance: No increased pain;Other (comment) (fear of falling and cognition) Patient left: in chair;with call bell/phone within reach;with chair alarm set;with family/visitor present Nurse Communication: Mobility status PT Visit Diagnosis: Unsteadiness on feet (R26.81);Other abnormalities of gait and mobility (R26.89);Muscle weakness (generalized) (M62.81);Difficulty in walking, not elsewhere classified (R26.2);Pain Pain - Right/Left: Left Pain - part of body: Leg    Time: 1120-1156 PT Time Calculation (min) (ACUTE ONLY): 36 min   Charges:   PT Evaluation $PT Eval Low Complexity: 1 Low PT Treatments $Therapeutic Activity: 8-22 mins PT General Charges $$ ACUTE PT VISIT: 1 Visit         Cary Clarks, PT Acute Rehab   Annalee Kiang 02/22/2024, 2:24 PM

## 2024-02-23 DIAGNOSIS — I824Y2 Acute embolism and thrombosis of unspecified deep veins of left proximal lower extremity: Secondary | ICD-10-CM | POA: Diagnosis not present

## 2024-02-23 DIAGNOSIS — I82422 Acute embolism and thrombosis of left iliac vein: Principal | ICD-10-CM

## 2024-02-23 DIAGNOSIS — N179 Acute kidney failure, unspecified: Principal | ICD-10-CM

## 2024-02-23 DIAGNOSIS — Z7189 Other specified counseling: Secondary | ICD-10-CM

## 2024-02-23 DIAGNOSIS — Z515 Encounter for palliative care: Secondary | ICD-10-CM

## 2024-02-23 DIAGNOSIS — R52 Pain, unspecified: Secondary | ICD-10-CM

## 2024-02-23 DIAGNOSIS — Z79899 Other long term (current) drug therapy: Secondary | ICD-10-CM

## 2024-02-23 DIAGNOSIS — F0390 Unspecified dementia without behavioral disturbance: Secondary | ICD-10-CM

## 2024-02-23 LAB — CBC
HCT: 25.1 % — ABNORMAL LOW (ref 36.0–46.0)
Hemoglobin: 7.6 g/dL — ABNORMAL LOW (ref 12.0–15.0)
MCH: 28.1 pg (ref 26.0–34.0)
MCHC: 30.3 g/dL (ref 30.0–36.0)
MCV: 93 fL (ref 80.0–100.0)
Platelets: 376 10*3/uL (ref 150–400)
RBC: 2.7 MIL/uL — ABNORMAL LOW (ref 3.87–5.11)
RDW: 15.6 % — ABNORMAL HIGH (ref 11.5–15.5)
WBC: 9.1 10*3/uL (ref 4.0–10.5)
nRBC: 0 % (ref 0.0–0.2)

## 2024-02-23 LAB — BASIC METABOLIC PANEL WITH GFR
Anion gap: 4 — ABNORMAL LOW (ref 5–15)
BUN: 50 mg/dL — ABNORMAL HIGH (ref 8–23)
CO2: 18 mmol/L — ABNORMAL LOW (ref 22–32)
Calcium: 8.2 mg/dL — ABNORMAL LOW (ref 8.9–10.3)
Chloride: 112 mmol/L — ABNORMAL HIGH (ref 98–111)
Creatinine, Ser: 1.15 mg/dL — ABNORMAL HIGH (ref 0.44–1.00)
GFR, Estimated: 45 mL/min — ABNORMAL LOW (ref >60)
Glucose, Bld: 132 mg/dL — ABNORMAL HIGH (ref 70–99)
Potassium: 4.7 mmol/L (ref 3.5–5.1)
Sodium: 134 mmol/L — ABNORMAL LOW (ref 135–145)

## 2024-02-23 LAB — GLUCOSE, CAPILLARY
Glucose-Capillary: 206 mg/dL — ABNORMAL HIGH (ref 70–99)
Glucose-Capillary: 154 mg/dL — ABNORMAL HIGH (ref 70–99)
Glucose-Capillary: 152 mg/dL — ABNORMAL HIGH (ref 70–99)
Glucose-Capillary: 157 mg/dL — ABNORMAL HIGH (ref 70–99)

## 2024-02-23 LAB — HEPARIN LEVEL (UNFRACTIONATED): Heparin Unfractionated: 0.51 [IU]/mL (ref 0.30–0.70)

## 2024-02-23 MED ORDER — POLYETHYLENE GLYCOL 3350 17 G PO PACK
17.0000 g | PACK | Freq: Every day | ORAL | Status: DC
  Administered 2024-02-25 – 2024-02-26 (×2): 17 g via ORAL
  Filled 2024-02-23 (×2): qty 1

## 2024-02-23 MED ORDER — APIXABAN 5 MG PO TABS
5.0000 mg | ORAL_TABLET | Freq: Two times a day (BID) | ORAL | Status: DC
  Administered 2024-02-23 – 2024-02-26 (×7): 5 mg via ORAL
  Filled 2024-02-23 (×7): qty 1

## 2024-02-23 MED ORDER — SENNA 8.6 MG PO TABS
1.0000 | ORAL_TABLET | Freq: Two times a day (BID) | ORAL | Status: DC
  Administered 2024-02-23 – 2024-02-26 (×6): 8.6 mg via ORAL
  Filled 2024-02-23 (×6): qty 1

## 2024-02-23 NOTE — Progress Notes (Addendum)
 PHARMACY - ANTICOAGULATION CONSULT NOTE  Pharmacy Consult for heparin Indication: DVT  Allergies  Allergen Reactions   Contrast Media [Iodinated Contrast Media] Hives and Shortness Of Breath    Hypaque 60%   Sulfa Antibiotics Hives and Shortness Of Breath   Azithromycin Rash   Codeine Nausea Only   Darvon [Propoxyphene] Nausea Only    Patient Measurements: Height: 5\' 2"  (157.5 cm) Weight: 40.8 kg (90 lb) IBW/kg (Calculated) : 50.1 HEPARIN DW (KG): 40.8  Vital Signs: Temp: 97.6 F (36.4 C) (06/03 0509) Temp Source: Axillary (06/03 0509) BP: 130/56 (06/03 0509) Pulse Rate: 87 (06/03 0509)  Labs: Recent Labs    02/21/24 0118 02/21/24 1006 02/21/24 1822 02/22/24 0417 02/22/24 0420 02/23/24 0441  HGB 9.0*  --   --   --  8.2* 7.6*  HCT 29.4*  --   --   --  25.9* 25.1*  PLT 331  --   --   --  326 376  HEPARINUNFRC 0.81*   < > 0.30  --  0.40 0.51  CREATININE 2.22*  --   --  1.57*  --  1.15*   < > = values in this interval not displayed.    Estimated Creatinine Clearance: 20.9 mL/min (A) (by C-G formula based on SCr of 1.15 mg/dL (H)).   Medical History: Past Medical History:  Diagnosis Date   Arthritis    Asthma    child   Diabetes mellitus without complication (HCC)    Heart murmur    History of hiatal hernia    Hypertension    Peripheral vascular disease (HCC)    left leg  dvt after hysterectomy 47 yrs ago   Pneumonia    grade school   Sleep apnea    pos study lost wt and does not use cpap last test 5-6 yrs ago    Assessment: 89 year old female presented with swelling of left lower extremity. Patient reports hurting leg recently. Patient not eating and drinking as well for a week or so. Ultrasound lower extremities with chronic DVT in right common femoral vein as well as acute DVT on left. No anticoagulation noted PTA. Pharmacy consulted to manage heparin.  02/23/24 Heparin level = 0.51 remains therapeutic on heparin infusion of 650 units/hr Hgb low  and slightly decreased, Plt WNL RN reported some dried blood around nostril where pt picking nose, no active bleeding.  Goal of Therapy:  Heparin level 0.3-0.7 units/ml Monitor platelets by anticoagulation protocol: Yes   Plan:  Continue heparin infusion at current rate of 650 units/hr CBC, heparin level daily Monitor for signs of bleeding Follow up long term anticoagulation plans  Shireen Dory, PharmD 02/23/24 7:33 AM  Addendum: Pharmacy consulted to transition UFH to apixaban. Discussed with MD - given anemia and bleeding risk, will forego the loading dose.  -Discontinue UFH -Start apixaban 5 mg PO BID -Education done  Pharmacy to sign off.   Shireen Dory, PharmD 02/23/24 12:09 PM

## 2024-02-23 NOTE — TOC Initial Note (Signed)
 Transition of Care [facility]) - Initial/Assessment Note    Patient Details  Name: Janet Gilbert MRN: [id number] Date of Birth: Aug 10, 1932  [facility] CM/SW Contact:    Ruben Corolla, RN Phone Number: 02/23/2024, 10:35 AM  Clinical Narrative: Already active w/[facility]-rep [facility] aware;No preference for otpt palliative care services-[facility] rep Shawn following. Has own transport home.                  Expected Discharge Plan: Home w Home Health Services Barriers to Discharge: Continued Medical Work up   Patient Goals and CMS Choice Patient states their goals for this hospitalization and ongoing recovery are:: Home CMS Medicare.gov Compare Post Acute Care list provided to:: Patient Represenative (must comment) ([name](dtr)) Choice offered to / list presented to : Adult Children  Bend ownership interest in [facility].provided to:: Adult Children    Expected Discharge Plan and Services   Discharge Planning Services: CM Consult Post Acute Care Choice: Home Health Living arrangements for the past 2 months: Single Family Home                           HH Arranged: PT HH Agency: [facility] Date HH Agency Contacted: 02/23/24 Time HH Agency Contacted: 1034 Representative spoke with at [facility] Agency: ArtaviaShylise  Prior Living Arrangements/Services Living arrangements for the past 2 months: Single Family Home Lives with:: Adult Children Patient language and need for interpreter reviewed:: Yes Do you feel safe going back to the place where you live?: Yes      Need for Family Participation in Patient Care: Yes (Comment) Care giver support system in place?: Yes (comment) Current home services: DME, Home PT (rw;Active [facility]) Criminal Activity/Legal Involvement Pertinent to Current Situation/Hospitalization: No - Comment as needed  Activities of Daily Living   ADL Screening (condition at time of  admission) Independently performs ADLs?: No Does the patient have a NEW difficulty with bathing/dressing/toileting/self-feeding that is expected to last >3 days?: No Does the patient have a NEW difficulty with getting in/out of bed, walking, or climbing stairs that is expected to last >3 days?: No Does the patient have a NEW difficulty with communication that is expected to last >3 days?: No Is the patient deaf or have difficulty hearing?: No Does the patient have difficulty seeing, even when wearing glasses/contacts?: No Does the patient have difficulty concentrating, remembering, or making decisions?: Yes  Permission Sought/Granted Permission sought to share information with : Case Manager Permission granted to share information with : Yes, Verbal Permission Granted  Share Information with NAME: Case Manager     Permission granted to share info w Relationship: [name] Seng-White (dtr) [phone]     Emotional Assessment Appearance:: Appears stated age Attitude/Demeanor/Rapport: Unable to Assess Affect (typically observed): Unable to Assess Orientation: :  (unable to assess) Alcohol / Substance Use: Not Applicable Psych Involvement: No (comment)  Admission diagnosis:  Acute deep vein thrombosis (DVT) of left lower extremity (HCC) [I82.402] Acute deep vein thrombosis (DVT) (HCC) [I82.409] Patient Active Problem List   Diagnosis Date Noted   Acute deep vein thrombosis (DVT) (HCC) 02/21/2024   Acute deep vein thrombosis (DVT) of left lower extremity (HCC) 02/20/2024   Aortic atherosclerosis (HCC) 02/20/2024   Essential hypertension 02/20/2024   Hypercholesterolemia 02/20/2024   Mild cognitive impairment 02/20/2024   Obstructive sleep apnea 02/20/2024   Stage 3b chronic kidney disease (HCC) 02/20/2024   Type 2 diabetes mellitus with diabetic  chronic kidney disease (HCC) 02/20/2024   Pain due to onychomycosis of toenails of both feet 12/17/2022   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) 11/01/2022   Gastroesophageal reflux disease without esophagitis 03/23/2018   Globus pharyngeus 03/23/2018   Hoarseness 03/23/2018   Hyperkalemia 01/02/2017   PCP:  Lysle Saunas, MD (Inactive) Pharmacy:   [id number] - [place], [place] - [address] AT [facility] [address] [place] [address] Phone: [phone] Fax: [phone]  [id number] - [place], [place] - [address] AT [facility] [address] [place] [address] Phone: [phone] Fax: [phone]  [id number] - [place], [place] - [address] [place] [address] Phone: [phone] Fax: [phone]     Social Drivers of Health (SDOH) Social History: SDOH Screenings   Food Insecurity: No Food Insecurity (02/21/2024)  Housing: Low Risk  (02/21/2024)  Transportation Needs: No Transportation Needs (02/21/2024)  Utilities: Not At Risk (02/21/2024)  Social Connections: Socially Integrated (02/20/2024)  Tobacco Use: Low Risk  (02/20/2024)  Recent Concern: Tobacco Use - Medium Risk (12/08/2023)   SDOH Interventions: Food Insecurity Interventions: Intervention Not Indicated, Inpatient TOC Housing Interventions: Intervention Not Indicated, Inpatient TOC Transportation Interventions: Intervention Not Indicated, Inpatient TOC Utilities Interventions: Intervention Not Indicated, Inpatient TOC   Readmission Risk Interventions     No data to display

## 2024-02-23 NOTE — Progress Notes (Signed)
 Daily Progress Note   Patient Name: Janet Gilbert       Date: 02/23/2024 DOB: [birth year]  Age: 89 y.o. MRN#: [id number] Attending Physician: Janet Macleod, MD Primary Care Physician: Janet Saunas, MD (Inactive) Admit Date: 02/20/2024 Length of Stay: 2 days  Reason for Consultation/Follow-up: Establishing goals of care  Subjective:   CC: Patient grimacing when presenting to bedside.  Following up regarding complex medical decision making.  Subjective:  Reviewed EMR prior to presenting to bedside.  At time of EMR review in past 24 hours patient has received as needed oxycodone  5 mg x 2 doses.  Review of patient's BMP noting creatinine trending down to 1.15 so GFR now 45.  Monitoring for medication management.  Presented to bedside to see patient.  Patient laying in bed grimacing and moaning at times.  Patient's daughter, Janet Gilbert, present at bedside.  Able to introduce myself as a member of the palliative medicine team.  Daughter noted that patient was having "spell" at this time.  Discussed that patient appeared to be in pain as she was showing many nonverbal signs of pain like grimacing, moaning, and grabbing at the bed.  Reviewed patient's pain medicines with daughter including scheduled Tylenol  and as needed oxycodone .  Discussed the patient had already received couple of doses of oxycodone  and will recommend dose now if that is okay with daughter due to patient's times of pain.  Daughter agreeing with this.  Patient even spoke to thanked this provider for going to discuss with nurse about providing pain medications. Also reviewed with daughter starting bowel regimen because daughter is concerned about patient becoming constipated while taking opioids. We were not able to review further complex medical decision making at this time due to patient's acute distress.  Discussed care with hospitalist and RN to coordinate care.  Review of Systems Pain Objective:   Vital Signs:  BP (!) 130/56  (BP Location: Left Arm)   Pulse 87   Temp 97.6 F (36.4 C) (Axillary)   Resp 19   Ht 5\' 2"  (1.575 m)   Wt 40.8 kg   SpO2 100%   BMI 16.46 kg/m   Physical Exam: General: Grimacing, moaning at times, cachectic, frail, chronically ill-appearing Cardiovascular: RRR Respiratory: no increased work of breathing noted, not in respiratory distress Neuro: Awake Psych: Appears uncomfortable  Imaging: I personally reviewed recent imaging.   Assessment & Plan:   Assessment: Patient is a 89 year old female with a past medical history of dementia, diabetes mellitus type 2, hypertension, hyperlipidemia, OSA not on CPAP, PAD, and left leg DVT 47 years ago after surgery who was admitted on 02/20/2024 for progressive weakness, confusion, and episodes of diarrhea 2 days prior to admission.  Patient had a fall 3 months ago and since that time has had decreased mobility.  Upon admission, patient found to have acute LLE DVT and AKI on CKD.  Palliative medicine team consulted to assist with complex medical decision making.  Recommendations/Plan: # Complex medical decision making/goals of care:  - Patient and daughter have been provided with MOST form to review.  PMT has recommended CODE STATUS of DNR/DNI along with palliative services upon discharge to continue conversations moving forward.  Unable to discuss this further today due to patient's requirements for symptom management with acute pain.  -  Code Status: Full Code  # Symptom management: Patient is receiving these palliative interventions for symptom management with an intent to improve quality of life.   - Pain, in the setting of acute  LLE DVT   - Continue oxycodone  5 mg every 6 hours as needed.  Can increase the lability to every 4 hours if needed.   - Bowel regimen, in setting of opioid use   - Start senna 1 tab twice daily   - Start MiraLAX  17 g on 02/24/2024.  # Psychosocial Support:  - Daughter-Janet Gilbert  # Discharge Planning: To Be  Determined  Discussed with: Patient, patient's daughter, hospitalist, RN  Thank you for allowing the palliative care team to participate in the care Janet Gilbert.  Janet Libel, DO Palliative Care Provider PMT # [phone]  If patient remains symptomatic despite maximum doses, please call PMT at [phone] between 0700 and 1900. Outside of these hours, please call attending, as PMT does not have night coverage.

## 2024-02-23 NOTE — Discharge Instructions (Signed)
 Information on my medicine - ELIQUIS  (apixaban )  This medication education was reviewed with me or my healthcare representative as part of my discharge preparation.  Why was Eliquis  prescribed for you? Eliquis  was prescribed to treat blood clots that may have been found in the veins of your legs (deep vein thrombosis) or in your lungs (pulmonary embolism) and to reduce the risk of them occurring again.  What do You need to know about Eliquis  ? The dose is ONE 5 mg tablet taken TWICE daily.  Eliquis  may be taken with or without food.   Try to take the dose about the same time in the morning and in the evening. If you have difficulty swallowing the tablet whole please discuss with your pharmacist how to take the medication safely.  Take Eliquis  exactly as prescribed and DO NOT stop taking Eliquis  without talking to the doctor who prescribed the medication.  Stopping may increase your risk of developing a new blood clot.  Refill your prescription before you run out.  After discharge, you should have regular check-up appointments with your healthcare provider that is prescribing your Eliquis .    What do you do if you miss a dose? If a dose of ELIQUIS  is not taken at the scheduled time, take it as soon as possible on the same day and twice-daily administration should be resumed. The dose should not be doubled to make up for a missed dose.  Important Safety Information A possible side effect of Eliquis  is bleeding. You should call your healthcare provider right away if you experience any of the following: ? Bleeding from an injury or your nose that does not stop. ? Unusual colored urine (red or dark brown) or unusual colored stools (red or black). ? Unusual bruising for unknown reasons. ? A serious fall or if you hit your head (even if there is no bleeding).  Some medicines may interact with Eliquis  and might increase your risk of bleeding or clotting while on Eliquis . To help avoid  this, consult your healthcare provider or pharmacist prior to using any new prescription or non-prescription medications, including herbals, vitamins, non-steroidal anti-inflammatory drugs (NSAIDs) and supplements.  This website has more information on Eliquis  (apixaban ): http://[email or web address] .com/eliquis Romaine Closs

## 2024-02-23 NOTE — Progress Notes (Signed)
 PROGRESS NOTE  Janet Gilbert  DOB: [birth year]  PCP: Lysle Saunas, MD (Inactive) JYN:[id number]  DOA: 02/20/2024  LOS: 2 days  Hospital Day: 4  Brief narrative: Janet Gilbert is a 89 y.o. female with PMH significant for dementia, DM2, HTN, HLD,, OSA not on CPAP, PAD, left leg DVT 47 years ago after surgery. Patient lives at home with her daughter's family. 3 months ago, she had a fall while in a shopping mall.  She hit her knee.  Per family imaging at that time ruled out any fracture.  She continued to have pain, was seen by PCP and was started on Motrin which she has been taking as needed. Her mobility not limited with the pain.  She has gotten progressively weak and confused.  Appetite and hydration got significantly worse.  She also had several episodes of diarrhea 2 days ago which made her dehydration worse 5/31, with worsening clinical symptoms, family decided to bring her to the ED.  In the ED, patient was afebrile, hemodynamically stable, breathing on room air Initial labs with sodium low at 130, potassium elevated to 5.3, serum bicarb 15, BUN/creatinine elevated to 65/3.12, WC count normal, lactic acid level normal, hemoglobin low at 8.6 Urinalysis with hazy yellow urine with negative leukocytes, negative nitrate, rare bacteria Chest x-ray unremarkable. X-ray left knee showed moderate joint effusion. No acute fracture or dislocation.  Ultrasound duplex of lower extremity showed -Chronic DVT right common femoral vein -Acute DVT of left external iliac vein, left common femoral vein, SF junction, left femoral vein, left proximal profunda vein, left popliteal vein, left posterior tibial vein and left peroneal vein. -Acute intramuscular thrombosis involving the left gastrocnemius veins.   Patient was started on IV hydration, IV heparin drip Admitted to [facility] See below for details  Subjective: Patient was seen and examined this morning.   Propped up in bed.  Alert, awake, oriented  to place only.  Not restless or agitated. Daughter at bedside. Appetite improving.  Had 2 bowel movements yesterday. Was seen by PT and orthopedic surgery yesterday.  Assessment and plan: Acute LLE DVT Chronic RLE DVT She injured her left leg 3 months ago which led to progressive immobility, and left> righ leg swelling. Ultrasound duplex showed acute LLE DVT up to Ilac vein and chronic RLE DVT Started on heparin drip Echo with LV EF 70 to 75%, grade 1 DD, right ventricle size and function normal, Discussed with family.  Plan to switch to Eliquis today.  I would prefer not to load her with 10 mg twice daily.  Probably just 5 mg twice daily because of the risk of bleeding. Patient will probably need next several days to weeks to get the swelling improved.  AKI on CKD 4 Acute metabolic acidosis Baseline creatinine less than 2 but from 2021. Presented with BUN/creatinine elevated to 65/3.12 and bicarb low at 15. Both creatinine and bicarb improving with IV hydration.  Adequately hydrated.  Can stop IV fluid today Continue to monitor  Recent Labs    02/20/24 1126 02/21/24 0118 02/22/24 0417 02/23/24 0441  BUN 65* 60* 56* 50*  CREATININE 3.12* 2.22* 1.57* 1.15*  CO2 15* 16* 17* 18*   Left knee swelling Started after fall 3 months ago.  X-ray without any fracture but showed joint effusion.   Seen by orthopedic Dr. Waylan Haggard yesterday.  Clinically no significant tenderness.  No need of arthrocentesis given high risk of bleeding on heparin drip. Continue pain management with scheduled Tylenol  and as needed  oxycodone .  Acute on chronic anemia Hemoglobin was slightly low at 2021. Presented with low hemoglobin at 8.6. No clear evidence of GI bleeding.  Hemoglobin slightly lower at 7.6 today.   Since there is no clear evidence of bleeding, decided to switch to Eliquis 5 mg twice daily today. Continue Protonix. Recent Labs    02/20/24 1258 02/21/24 0118 02/21/24 1006 02/22/24 0420  02/23/24 0441  HGB 8.6* 9.0*  --  8.2* 7.6*  MCV 91.4 93.3  --  89.9 93.0  VITAMINB12  --   --  745  --   --   FOLATE  --   --  16.5  --   --   FERRITIN  --   --  253  --   --   TIBC  --   --  174*  --   --   IRON  --   --  7*  --   --    GERD Continue PPI,  Hypertension PTA on amlodipine .  Currently on hold  Aortic atherosclerosis HLD Continue Lipitor   OSA Does not use CPAP  Dementia with behavioral issues  anxiety/depression Per daughter, she has noticed dementia worsening.  Patient has significant sundowning at home and sometimes also agitation.   PTA meds-Seroquel 50 mg nightly, sertraline 100 mg daily, Remeron 7.5 mg nightly, Ativan 0.5 mg nightly as needed, Tylenol  PM nightly as needed Currently continued on all.  May also need as needed IV Ativan here. Given her progression of dementia, progressive poor oral intake, weakness, palliative care consultation was obtained.  Family opted for DNR/DNI.   Mobility: Encourage ambulation.  PT eval obtained.  Home health PT recommended  Goals of care   Code Status: Full Code  Palliative care consulted   DVT prophylaxis: Heparin drip  apixaban (ELIQUIS) tablet 5 mg   Antimicrobials: None Fluid: Can stop IV fluid today Consultants: None Family Communication: Daughter [name] at bedside  Status: inpatient Level of care:  Telemetry   Patient is from: Home Needs to continue in-hospital care: Monitor hemoglobin on heparin drip Anticipated d/c to: Hopefully home in 1 to 2 days   Diet:  Diet Order             Diet heart healthy/carb modified Fluid consistency: Thin  Diet effective now                   Scheduled Meds:  acetaminophen   1,000 mg Oral TID   apixaban  5 mg Oral BID   atorvastatin   20 mg Oral Daily   feeding supplement  237 mL Oral BID BM   insulin  aspart  0-9 Units Subcutaneous TID WC   mirtazapine  7.5 mg Oral QHS   pantoprazole  40 mg Oral Daily   [START ON 02/24/2024] polyethylene glycol  17  g Oral Daily   QUEtiapine  50 mg Oral QHS   senna  1 tablet Oral BID   sertraline  100 mg Oral Daily    PRN meds: diphenhydrAMINE  **AND** [DISCONTINUED] acetaminophen , LORazepam, ondansetron  **OR** ondansetron  (ZOFRAN ) IV, oxyCODONE , risperiDONE   Infusions:     Antimicrobials: Anti-infectives (From admission, onward)    None       Objective: Vitals:   02/23/24 0509 02/23/24 1302  BP: (!) 130/56 (!) 121/58  Pulse: 87 88  Resp: 19 20  Temp: 97.6 F (36.4 C) (!) 97.5 F (36.4 C)  SpO2: 100% 100%    Intake/Output Summary (Last 24 hours) at 02/23/2024 1359 Last data filed  at 02/23/2024 1300 Gross per 24 hour  Intake 1645.8 ml  Output 1275 ml  Net 370.8 ml   Filed Weights   02/20/24 1433  Weight: 40.8 kg   Weight change:  Body mass index is 16.46 kg/m.   Physical Exam: General exam: Pleasant, elderly thin built African-American female.   Skin: No rashes, lesions or ulcers. HEENT: Atraumatic, normocephalic, no obvious bleeding Lungs: Clear to auscultation bilaterally,  CVS: S1, S2, no murmur,   GI/Abd: Soft, nontender, nondistended, bowel sound present,   CNS: Alert, awake, oriented to place.  Demented at baseline.   Psychiatry: Mood appropriate,  Extremities: Continues to have left pedal edema, no calf tenderness.  Left knee slightly as well but no tenderness observed  Data Review: I have personally reviewed the laboratory data and studies available.  F/u labs ordered Unresulted Labs (From admission, onward)     Start     Ordered   02/24/24 0500  CBC  Tomorrow morning,   R        02/23/24 1216   02/23/24 0500  Basic metabolic panel with GFR  Daily,   R      02/22/24 0758   02/21/24 0847  Reticulocytes  (Anemia Panel (PNL))  Add-on,   AD        02/21/24 0846   02/21/24 0500  Hemoglobin A1c  Tomorrow morning,   R       Comments: To assess prior glycemic control    02/20/24 1741   02/20/24 1120  CBC with Differential  Once,   STAT        02/20/24 1119             Signed, Hoyt Macleod, MD [facility] 02/23/2024

## 2024-02-23 NOTE — Progress Notes (Signed)
 Janet Gilbert [facility] Liaison Note:  Notified by [facility] manager of patient/family request for [facility] Palliative services at home after discharge.   Please call with any hospice or outpatient palliative care related questions.   Thank you for the opportunity to participate in this patient's care.   Madelene Schanz, BSN, RN, OCN [employer] [phone]

## 2024-02-24 DIAGNOSIS — Z7189 Other specified counseling: Secondary | ICD-10-CM

## 2024-02-24 DIAGNOSIS — R627 Adult failure to thrive: Secondary | ICD-10-CM

## 2024-02-24 DIAGNOSIS — N1832 Chronic kidney disease, stage 3b: Secondary | ICD-10-CM

## 2024-02-24 DIAGNOSIS — Z66 Do not resuscitate: Secondary | ICD-10-CM

## 2024-02-24 DIAGNOSIS — I824Y2 Acute embolism and thrombosis of unspecified deep veins of left proximal lower extremity: Secondary | ICD-10-CM | POA: Diagnosis not present

## 2024-02-24 LAB — BASIC METABOLIC PANEL WITH GFR
Anion gap: 4 — ABNORMAL LOW (ref 5–15)
Anion gap: 5 (ref 5–15)
BUN: 58 mg/dL — ABNORMAL HIGH (ref 8–23)
BUN: 59 mg/dL — ABNORMAL HIGH (ref 8–23)
CO2: 20 mmol/L — ABNORMAL LOW (ref 22–32)
CO2: 20 mmol/L — ABNORMAL LOW (ref 22–32)
Calcium: 8.5 mg/dL — ABNORMAL LOW (ref 8.9–10.3)
Calcium: 8.8 mg/dL — ABNORMAL LOW (ref 8.9–10.3)
Chloride: 110 mmol/L (ref 98–111)
Chloride: 112 mmol/L — ABNORMAL HIGH (ref 98–111)
Creatinine, Ser: 1.47 mg/dL — ABNORMAL HIGH (ref 0.44–1.00)
Creatinine, Ser: 1.55 mg/dL — ABNORMAL HIGH (ref 0.44–1.00)
GFR, Estimated: 34 mL/min — ABNORMAL LOW (ref >60)
GFR, Estimated: 32 mL/min — ABNORMAL LOW (ref >60)
Glucose, Bld: 128 mg/dL — ABNORMAL HIGH (ref 70–99)
Glucose, Bld: 181 mg/dL — ABNORMAL HIGH (ref 70–99)
Potassium: 5.3 mmol/L — ABNORMAL HIGH (ref 3.5–5.1)
Potassium: 5.4 mmol/L — ABNORMAL HIGH (ref 3.5–5.1)
Sodium: 134 mmol/L — ABNORMAL LOW (ref 135–145)
Sodium: 137 mmol/L (ref 135–145)

## 2024-02-24 LAB — CBC
HCT: 26.2 % — ABNORMAL LOW (ref 36.0–46.0)
Hemoglobin: 8 g/dL — ABNORMAL LOW (ref 12.0–15.0)
MCH: 28.5 pg (ref 26.0–34.0)
MCHC: 30.5 g/dL (ref 30.0–36.0)
MCV: 93.2 fL (ref 80.0–100.0)
Platelets: 450 10*3/uL — ABNORMAL HIGH (ref 150–400)
RBC: 2.81 MIL/uL — ABNORMAL LOW (ref 3.87–5.11)
RDW: 15.8 % — ABNORMAL HIGH (ref 11.5–15.5)
WBC: 8.8 10*3/uL (ref 4.0–10.5)
nRBC: 0 % (ref 0.0–0.2)

## 2024-02-24 LAB — GLUCOSE, CAPILLARY
Glucose-Capillary: 139 mg/dL — ABNORMAL HIGH (ref 70–99)
Glucose-Capillary: 208 mg/dL — ABNORMAL HIGH (ref 70–99)
Glucose-Capillary: 211 mg/dL — ABNORMAL HIGH (ref 70–99)
Glucose-Capillary: 83 mg/dL (ref 70–99)

## 2024-02-24 MED ORDER — SODIUM CHLORIDE 0.9 % IV SOLN: INTRAVENOUS | Status: AC

## 2024-02-24 MED ORDER — SALINE SPRAY 0.65 % NA SOLN
1.0000 | NASAL | Status: DC | PRN
  Administered 2024-02-24: 1 via NASAL
  Filled 2024-02-24 (×2): qty 44

## 2024-02-24 NOTE — TOC Progression Note (Signed)
 Transition of Care [facility]) - Progression Note    Patient Details  Name: Janet Gilbert MRN: [id number] Date of Birth: 1932/04/17  Transition of Care [facility] CM/SW Contact  Tamarius Rosenfield, Thersia Flax, RN Phone Number: 02/24/2024, 2:47 PM  Clinical Narrative: Eldora Greet to dtr [name]-d/c plan home w/hospice-[facility] accepted-dme to arrive in home by tomorrow-once dme in home sylvia will call nsg station for PTAR to pick up-Nurse will contact Nurse Case Manager.      Expected Discharge Plan: Home w Hospice Care Barriers to Discharge: Continued Medical Work up  Expected Discharge Plan and Services   Discharge Planning Services: CM Consult Post Acute Care Choice: Hospice Living arrangements for the past 2 months: Single Family Home                           HH Arranged: RN [facility] Agency: [facility] Date [facility] Agency Contacted: 02/24/24 Time HH Agency Contacted: 1447 Representative spoke with at [facility] Agency: Shawn   Social Determinants of Health (SDOH) Interventions SDOH Screenings   Food Insecurity: No Food Insecurity (02/21/2024)  Housing: Low Risk  (02/21/2024)  Transportation Needs: No Transportation Needs (02/21/2024)  Utilities: Not At Risk (02/21/2024)  Social Connections: Socially Integrated (02/20/2024)  Tobacco Use: Low Risk  (02/20/2024)  Recent Concern: Tobacco Use - Medium Risk (12/08/2023)    Readmission Risk Interventions     No data to display

## 2024-02-24 NOTE — Progress Notes (Signed)
 Daily Progress Note   Patient Name: Janet Gilbert       Date: 02/24/2024 DOB: 12/28/1931  Age: 89 y.o. MRN#: [id number] Attending Physician: Verlyn Goad, MD Primary Care Physician: Lysle Saunas, MD (Inactive) Admit Date: 02/20/2024 Length of Stay: 3 days  Reason for Consultation/Follow-up: Establishing goals of care  Subjective:   CC: Patient laying calmly in bed.  Following up regarding complex medical decision making.  Subjective:  Reviewed EMR prior to presenting to bedside.  At time of EMR review in past 24 hours patient has received as needed oxycodone  5 mg x 3 doses.  Presented to bedside to see patient.  Patient laying calmly in bed.  Patient's daughter, [name], present at bedside.  Able to discuss care planning at this time.  Spent time reviewing patient's medical course.  Spent time explaining DVT and how this developed.  Spent time learning about patient's time at home.  Patient has declined over the past few months since a fall.  Daughter also notes patient has continued decrease in oral intake.  Spent time discussing this process and normalizing it as someone ages.  Discussed appetite stimulants which patient is already on Megace.  Daughter notes that she does not feel it has provided much benefit to patient.  Agreed with this as not great data to show that appetite stimulants can improve this situation. Spent time learning about hopes for patient's care moving forward.  Daughter would want patient to have time at home with family as that is considered quality time.  Daughter notes she would want patient to be comfortable wherever she is at.  Patient has gone from ambulatory to essentially bedbound.  Discussed this along with decreased oral intake associated with decreasing overall prognosis.  Daughter acknowledged this.  We were able to discuss palliative medicine support at home versus hospice support.  Spent time answering questions as able regarding each.  After discussion,  daughter agreeing that home hospice support would be more appropriate and aligned with their goals for patient's medical care moving forward.  They do not want patient to have to continue returning to the hospital knowing that she is going to worsen with her minimal oral intake.  Acknowledged this.  Noted would reach out to team to assist with coordination of this care and planning to get home with hospice moving forward.  Also able to discuss CODE STATUS at this time.  Explained full code versus DNR/DNI.  Daughter agreeing with change of CODE STATUS to DNR/DNI at this time.  Spent time answering questions as able.  Noted palliative medicine team to continue following patient's medical journey.  Discussed care with hospitalist, TOC, RN, and ACC liaison to coordinate care.  Review of Systems Minimal oral intake Objective:   Vital Signs:  BP (!) 146/61 (BP Location: Left Arm)   Pulse 85   Temp 97.8 F (36.6 C) (Axillary)   Resp 20   Ht 5\' 2"  (1.575 m)   Wt 40.8 kg   SpO2 99%   BMI 16.46 kg/m   Physical Exam: General: NAD, pleasantly interactive though confused, cachectic, frail, chronically ill-appearing Cardiovascular: RRR Respiratory: no increased work of breathing noted, not in respiratory distress Neuro: Awake Psych: Appears uncomfortable  Imaging: I personally reviewed recent imaging.   Assessment & Plan:   Assessment: Patient is a 89 year old female with a past medical history of dementia, diabetes mellitus type 2, hypertension, hyperlipidemia, OSA not on CPAP, PAD, and left leg DVT 47 years ago after surgery who  was admitted on 02/20/2024 for progressive weakness, confusion, and episodes of diarrhea 2 days prior to admission.  Patient had a fall 3 months ago and since that time has had decreased mobility.  Upon admission, patient found to have acute LLE DVT and AKI on CKD.  Palliative medicine team consulted to assist with complex medical decision  making.  Recommendations/Plan: # Complex medical decision making/goals of care:  - Patient engaged in conversation though can become easily confused.  Discussed care with patient and daughter at bedside as detailed above in HPI.  Patient has been having decreased oral intake and functional status for months.  Normalized aging process and discussed ways to appropriately manage while balancing quality of life.  Daughter notes that patient's comfort and quality of life moving forward is most important.  Patient's quality time would be spent at home with family.  Discussed palliative medicine at home versus hospice support.  Daughter wanting to transition to going home with hospice at this time.  TOC and ACC liaison informed of this.  Palliative medicine team continue to follow along and engage in conversation as able and appropriate.  -  Code Status: Limited: Do not attempt resuscitation (DNR) -DNR-LIMITED -Do Not Intubate/DNI    - Discussed CODE STATUS detailing full code versus DNR/DNI.  Daughter appropriately agreeing with change of CODE STATUS to DNR/DNI at this time.   - Placed signed gold form on patient's paper chart.  # Symptom management: Patient is receiving these palliative interventions for symptom management with an intent to improve quality of life.   - Pain, in the setting of acute LLE DVT   - Continue oxycodone  5 mg every 6 hours as needed.  Can increase the lability to every 4 hours if needed.   - Bowel regimen, in setting of opioid use   - Continue senna 1 tab twice daily   - Continue MiraLAX  17 g on 02/24/2024.  # Psychosocial Support:  - Daughter-[name]  # Discharge Planning: Home with Hospice with [facility] hospice  Discussed with: Patient, patient's daughter, hospitalist, RN, TOC, ACC liaison  Thank you for allowing the palliative care team to participate in the care [facility].  Barnett Libel, DO Palliative Care Provider PMT # [phone]  If patient remains symptomatic  despite maximum doses, please call PMT at [phone] between 0700 and 1900. Outside of these hours, please call attending, as PMT does not have night coverage.

## 2024-02-24 NOTE — Progress Notes (Signed)
 Physical Therapy Treatment Patient Details Name: Janet Gilbert MRN: [id number] DOB: 1931/10/07 Today's Date: 02/24/2024   History of Present Illness 89 yo female presents to therapy following hospital admission on 02/20/2024 due to progression weakness and confusion with poor PO intake and several episodes of loose bowels. Pt sustained fall 3 months ago with residual LE pain and edema pt was found to have multiple L LE DVTs and L intramuscular thrombosis of gastroc in addition to AKI on CKD IV, acute metabolic acidosis, hyperkalemia, and anemia. Pt PMH includes but is not limited to: chronic R femoral DVT, dementia, OSA, PVD, GERD, OSA, DM II, HTN, and R reverse TSA.    PT Comments  Pt seen for PT tx with pt received in bed, daughter [name]) present in room. Pt is initially pleasant & agreeable to getting OOB but becomes easily irritated when daughter assists with donning BLE shoes, noting BLE pain. PT provides assistance for donning socks, pt requiring max assist to lift foot off bed. Pt agreeable to sitting EOB then declines, PT attempts to facilitate by moving BLE to EOB but pt begins crying out not to, crying for her family despite daughter being in room. [name] reports these behaviors are normal for pt. PT educated [place] DME recommendations to reduce caregiver burden & she's agreeable. Will continue to follow pt acutely to progress mobility as able.    If plan is discharge home, recommend the following: Two people to help with walking and/or transfers;Two people to help with bathing/dressing/bathroom;Assistance with cooking/housework;Direct supervision/assist for financial management;Direct supervision/assist for medications management;Assist for transportation;Help with stairs or ramp for entrance;Supervision due to cognitive status   Can travel by private vehicle        Equipment Recommendations  Wheelchair cushion (measurements PT);Hoyer lift;Wheelchair (measurements PT);Hospital bed     Recommendations for Other Services       Precautions / Restrictions Precautions Precautions: Fall Restrictions Weight Bearing Restrictions Per Provider Order: No     Mobility  Bed Mobility               General bed mobility comments: PT attempted to assist pt with moving BLE to EOB with max assist but pt crying out not to 2/2 pain    Transfers                        Ambulation/Gait                   Stairs             Wheelchair Mobility     Tilt Bed    Modified Rankin (Stroke Patients Only)       Balance                                            Communication    Cognition Arousal: Alert Behavior During Therapy: Flat affect, Agitated, Anxious   PT - Cognitive impairments: History of cognitive impairments                       PT - Cognition Comments: labile during session   Following commands impaired: Follows one step commands inconsistently, Follows one step commands with increased time    Cueing Cueing Techniques: Verbal cues, Gestural cues, Tactile cues, Visual cues  Exercises      General Comments  Pertinent Vitals/Pain Pain Assessment Pain Assessment: Faces Faces Pain Scale: Hurts whole lot Pain Location: BLE with movement/touch Pain Descriptors / Indicators: Constant, Discomfort, Crying, Moaning, Grimacing, Guarding Pain Intervention(s): Limited activity within patient's tolerance, Repositioned    Home Living                          Prior Function            PT Goals (current goals can now be found in the care plan section) Acute Rehab PT Goals PT Goal Formulation: Patient unable to participate in goal setting Progress towards PT goals: Progressing toward goals    Frequency    Min 2X/week      PT Plan      Co-evaluation              AM-PAC PT "6 Clicks" Mobility   Outcome Measure  Help needed turning from your back to your side while in  a flat bed without using bedrails?: A Lot Help needed moving from lying on your back to sitting on the side of a flat bed without using bedrails?: Total Help needed moving to and from a bed to a chair (including a wheelchair)?: Total Help needed standing up from a chair using your arms (e.g., wheelchair or bedside chair)?: Total Help needed to walk in hospital room?: Total Help needed climbing 3-5 steps with a railing? : Total 6 Click Score: 7    End of Session   Activity Tolerance: Patient limited by pain (limited 2/2 impaired cognition) Patient left: in bed;with call bell/phone within reach;with bed alarm set;with family/visitor present   PT Visit Diagnosis: Unsteadiness on feet (R26.81);Other abnormalities of gait and mobility (R26.89);Muscle weakness (generalized) (M62.81);Difficulty in walking, not elsewhere classified (R26.2);Pain Pain - Right/Left:  (bilateral) Pain - part of body: Leg;Ankle and joints of foot     Time: 1208-1219 PT Time Calculation (min) (ACUTE ONLY): 11 min  Charges:    $Therapeutic Activity: 8-22 mins PT General Charges $$ ACUTE PT VISIT: 1 Visit                     Emaline Handsome, PT, DPT 02/24/24, 12:33 PM    Venetta Gill 02/24/2024, 12:31 PM

## 2024-02-24 NOTE — Progress Notes (Signed)
 PROGRESS NOTE    Janet Gilbert  QIO:[id number] DOB: 10-19-1931 DOA: 02/20/2024 PCP: Lysle Saunas, MD (Inactive)   Brief Narrative: Janet Gilbert is a 89 y.o. female with a history of osteoarthritis, asthma, diabetes mellitus type 2, hiatal hernia, hypertension, peripheral vascular disease, sleep apnea, dementia.  Patient presented secondary to left lower extremity edema was found to have evidence of significant left lower extremity DVT.  Patient started on heparin IV for management.  Hospitalization complicated by associated AKI which has improved with IV fluids.  Palliative care was consulted and patient has transition to home with hospice care.   Assessment and Plan:  Acute left lower extremity DVT Chronic right lower extremity DVT Provoked secondary to prolonged and progressive immobility.  Patient presented with left greater than right leg swelling was found to have evidence of an acute lower extremity DVT involving the external iliac vein, left common femoral vein, SF junction, left femoral vein, left proximal profunda vein, left popliteal vein, left posterior tibial veins, and left peroneal vein in addition to an acute intramuscular thrombus involving the left gastrocnemius vein.  Patient was started on heparin drip for management with transition to Eliquis. -Continue Eliquis 5 mg twice daily  AKI on CKD stage IV No recent baseline available.  Last creatinine of 1.59 from 2021.  Creatinine of 3.12 on admission likely related to poor oral intake and dehydration.  Patient managed with IV fluids with resultant improvement in creatinine; creatinine slightly increased after discontinuation of IV fluids. - Restart IV fluids and recheck BMP in a.m. - Encourage increased oral intake/hydration  Hyperkalemia Appears to be iatrogenic as patient is on potassium supplementation.  Complicated by AKI in addition to severe CKD. - Discontinue potassium supplementation - Recheck potassium  Acute  metabolic acidosis Likely secondary to AKI.  Mild.  Improved.  Left knee swelling Like related to history of fall.  Left knee x-ray was obtained and was significant for moderate joint effusion.  Currently asymptomatic.  Orthopedic surgery was consulted and recommended against inpatient aspiration or injection secondary to high risk for hemarthrosis in setting of anticoagulation.  Acute on chronic anemia No recent baseline hemoglobin available.  Last hemoglobin of 11.7 from 2021.  Hemoglobin of 8.6 on admission with mild decrease likely related to IV fluids.  Currently stable.  GERD - Continue Protonix  Primary hypertension Patient is on amlodipine  as an outpatient which was held.  Blood pressure mostly controlled.  Aortic atherosclerosis Hyperlipidemia - Continue Lipitor  OSA Patient currently does not use CPAP as an outpatient.  Dementia Patient formally diagnosed this March by neurology.  Patient on Seroquel as an outpatient.  Patient with associated behavioral disturbances.  At times, patient declines to eat. - Continue Seroquel  Anxiety Depression - Continue Zoloft and Remeron  Underweight Estimated body mass index is 16.46 kg/m as calculated from the following:   Height as of this encounter: 5\' 2"  (1.575 m).   Weight as of this encounter: 40.8 kg.   DVT prophylaxis: Eliquis Code Status:   Code Status: Full Code Family Communication: Daughter via telephone.  No family at bedside Disposition Plan: Discharge home pending equipment delivery for hospice care at home   Consultants:  Palliative care medicine Orthopedic surgery  Procedures:  Lower extremity venous duplex  Antimicrobials: None   Subjective: No concerns this morning. States she lives at home with her mother and father. Says she's about 84 years old or so. She does not remember being in the hospital or that  she was diagnosed with a blood clot in her leg. She reports that she has no memory  issues.  Objective: BP (!) 146/61 (BP Location: Left Arm)   Pulse 85   Temp 97.8 F (36.6 C) (Axillary)   Resp 20   Ht 5\' 2"  (1.575 m)   Wt 40.8 kg   SpO2 99%   BMI 16.46 kg/m   Examination:  General exam: Appears calm and comfortable Respiratory system: Clear to auscultation. Respiratory effort normal. Cardiovascular system: S1 & fixed split S2 heard, RRR. Gastrointestinal system: Abdomen is nondistended, soft and nontender. Normal bowel sounds heard. Central nervous system: Alert and oriented to person. No focal neurological deficits. Musculoskeletal: LLE 2+ pitting edema. No calf tenderness Skin: No cyanosis. No rashes   Data Reviewed: I have personally reviewed following labs and imaging studies  CBC Lab Results  Component Value Date   WBC 8.8 02/24/2024   RBC 2.81 (L) 02/24/2024   HGB 8.0 (L) 02/24/2024   HCT 26.2 (L) 02/24/2024   MCV 93.2 02/24/2024   MCH 28.5 02/24/2024   PLT 450 (H) 02/24/2024   MCHC 30.5 02/24/2024   RDW 15.8 (H) 02/24/2024   LYMPHSABS 1.4 02/20/2024   MONOABS 0.9 02/20/2024   EOSABS 0.0 02/20/2024   BASOSABS 0.0 02/20/2024     Last metabolic panel Lab Results  Component Value Date   NA 134 (L) 02/24/2024   K 5.3 (H) 02/24/2024   CL 110 02/24/2024   CO2 20 (L) 02/24/2024   BUN 58 (H) 02/24/2024   CREATININE 1.47 (H) 02/24/2024   GLUCOSE 128 (H) 02/24/2024   GFRNONAA 34 (L) 02/24/2024   GFRAA 34 (L) 06/20/2020   CALCIUM  8.5 (L) 02/24/2024   PROT 6.6 02/21/2024   ALBUMIN  2.8 (L) 02/21/2024   BILITOT 0.8 02/21/2024   ALKPHOS 66 02/21/2024   AST 37 02/21/2024   ALT 22 02/21/2024   ANIONGAP 4 (L) 02/24/2024    GFR: Estimated Creatinine Clearance: 16.4 mL/min (A) (by C-G formula based on SCr of 1.47 mg/dL (H)).  No results found for this or any previous visit (from the past 240 hours).    Radiology Studies: No results found.    LOS: 3 days    [name], MD [facility] 02/24/2024, 9:30 AM   If 7PM-7AM,  please contact night-coverage [email or web address]

## 2024-02-24 NOTE — Hospital Course (Signed)
 Janet Gilbert is a 89 y.o. female with a history of osteoarthritis, asthma, diabetes mellitus type 2, hiatal hernia, hypertension, peripheral vascular disease, sleep apnea, dementia.  Patient presented secondary to left lower extremity edema was found to have evidence of significant left lower extremity DVT.  Patient started on heparin IV for management.  Hospitalization complicated by associated AKI which has improved with IV fluids.  Palliative care was consulted and patient has transitioned to home with hospice care.

## 2024-02-24 NOTE — Progress Notes (Signed)
 [name] [address] [facility] Liaison Note Received request for hospice services at home after discharge. Spoke with daughter, [name] to initiate education related to hospice philosophy, services, and team approach to care. Patient/family verbalized understanding of information given. Per discussion, the plan is for discharge home by PTAR/EMS tomorrow after DME is delivered.    DME needs discussed. Patient has the a walker, rollator, and bedside commode in the home. Patient/family requests the following equipment for delivery: hospital bed, overbed table, and wheelchair. The address has been verified and is correct in the chart. [name] is the family contact to arrange time of equipment delivery. She reports will likely need to wait until tomorrow morning to schedule delivery.  Please send signed and completed DNR home with patient/family. Please provide prescriptions at discharge as needed to ensure ongoing symptom management.   [facility] information and contact numbers given to [place]. Above information shared with Thersia Flax, Transitions of Care Manager. Please call with any questions or concerns.   Thank you for the opportunity to participate in this patient's care.   Madelene Schanz BSN, Charity fundraiser, OCN [employer] [phone]

## 2024-02-25 DIAGNOSIS — I824Y2 Acute embolism and thrombosis of unspecified deep veins of left proximal lower extremity: Secondary | ICD-10-CM | POA: Diagnosis not present

## 2024-02-25 LAB — BASIC METABOLIC PANEL WITH GFR
Anion gap: 4 — ABNORMAL LOW (ref 5–15)
BUN: 56 mg/dL — ABNORMAL HIGH (ref 8–23)
CO2: 21 mmol/L — ABNORMAL LOW (ref 22–32)
Calcium: 8.7 mg/dL — ABNORMAL LOW (ref 8.9–10.3)
Chloride: 112 mmol/L — ABNORMAL HIGH (ref 98–111)
Creatinine, Ser: 1.43 mg/dL — ABNORMAL HIGH (ref 0.44–1.00)
GFR, Estimated: 35 mL/min — ABNORMAL LOW (ref >60)
Glucose, Bld: 120 mg/dL — ABNORMAL HIGH (ref 70–99)
Potassium: 5.4 mmol/L — ABNORMAL HIGH (ref 3.5–5.1)
Sodium: 137 mmol/L (ref 135–145)

## 2024-02-25 LAB — GLUCOSE, CAPILLARY
Glucose-Capillary: 129 mg/dL — ABNORMAL HIGH (ref 70–99)
Glucose-Capillary: 205 mg/dL — ABNORMAL HIGH (ref 70–99)
Glucose-Capillary: 204 mg/dL — ABNORMAL HIGH (ref 70–99)
Glucose-Capillary: 151 mg/dL — ABNORMAL HIGH (ref 70–99)

## 2024-02-25 LAB — POTASSIUM
Potassium: 5.5 mmol/L — ABNORMAL HIGH (ref 3.5–5.1)
Potassium: 5.6 mmol/L — ABNORMAL HIGH (ref 3.5–5.1)

## 2024-02-25 MED ORDER — SODIUM ZIRCONIUM CYCLOSILICATE 10 G PO PACK
10.0000 g | PACK | Freq: Three times a day (TID) | ORAL | Status: DC
  Administered 2024-02-25 (×2): 10 g via ORAL
  Filled 2024-02-25 (×2): qty 1

## 2024-02-25 MED ORDER — OXYCODONE HCL 5 MG PO TABS: 5.0000 mg | ORAL_TABLET | Freq: Once | ORAL | Status: DC

## 2024-02-25 MED ORDER — SODIUM ZIRCONIUM CYCLOSILICATE 10 G PO PACK
10.0000 g | PACK | Freq: Once | ORAL | Status: AC
  Administered 2024-02-25: 10 g via ORAL
  Filled 2024-02-25: qty 1

## 2024-02-25 MED ORDER — SODIUM ZIRCONIUM CYCLOSILICATE 10 G PO PACK
10.0000 g | PACK | Freq: Once | ORAL | Status: DC
  Filled 2024-02-25: qty 1

## 2024-02-25 MED ORDER — OXYCODONE HCL 5 MG PO TABS
5.0000 mg | ORAL_TABLET | ORAL | Status: DC | PRN
  Administered 2024-02-25: 5 mg via ORAL
  Filled 2024-02-25: qty 1

## 2024-02-25 MED ORDER — LORAZEPAM 0.5 MG PO TABS
0.5000 mg | ORAL_TABLET | Freq: Two times a day (BID) | ORAL | Status: DC | PRN
  Administered 2024-02-25 – 2024-02-26 (×2): 0.5 mg via ORAL
  Filled 2024-02-25 (×2): qty 1

## 2024-02-25 NOTE — Progress Notes (Addendum)
 PROGRESS NOTE    Janet Gilbert  WUJ:[id number] DOB: 1932/01/11 DOA: 02/20/2024 PCP: Lysle Saunas, MD (Inactive)   Brief Narrative: Janet Gilbert is a 89 y.o. female with a history of osteoarthritis, asthma, diabetes mellitus type 2, hiatal hernia, hypertension, peripheral vascular disease, sleep apnea, dementia.  Patient presented secondary to left lower extremity edema was found to have evidence of significant left lower extremity DVT.  Patient started on heparin IV for management.  Hospitalization complicated by associated AKI which has improved with IV fluids.  Palliative care was consulted and patient has transition to home with hospice care.   Assessment and Plan:  Acute left lower extremity DVT Chronic right lower extremity DVT Provoked secondary to prolonged and progressive immobility.  Patient presented with left greater than right leg swelling was found to have evidence of an acute lower extremity DVT involving the external iliac vein, left common femoral vein, SF junction, left femoral vein, left proximal profunda vein, left popliteal vein, left posterior tibial veins, and left peroneal vein in addition to an acute intramuscular thrombus involving the left gastrocnemius vein.  Patient was started on heparin drip for management with transition to Eliquis. -Continue Eliquis 5 mg twice daily  AKI on CKD stage IV No recent baseline available.  Last creatinine of 1.59 from 2021.  Creatinine of 3.12 on admission likely related to poor oral intake and dehydration.  Patient managed with IV fluids with resultant improvement in creatinine; creatinine slightly increased after discontinuation of IV fluids. Improved. - Encourage increased oral intake/hydration  Hyperkalemia Appears to be iatrogenic as patient is on potassium supplementation.  Complicated by AKI in addition to severe CKD. Potassium supplementation discontinued. Lokelma x1 given. Potassium continues to rise at 5.5 today. -  Recheck potassium and re-dose Lokelma if needed  Acute metabolic acidosis Likely secondary to AKI.  Mild.  Improved.  Left knee swelling Like related to history of fall.  Left knee x-ray was obtained and was significant for moderate joint effusion.  Currently asymptomatic.  Orthopedic surgery was consulted and recommended against inpatient aspiration or injection secondary to high risk for hemarthrosis in setting of anticoagulation.  Acute on chronic anemia No recent baseline hemoglobin available.  Last hemoglobin of 11.7 from 2021.  Hemoglobin of 8.6 on admission with mild decrease likely related to IV fluids.  Currently stable.  Hyponatremia Resolved.  GERD - Continue Protonix  Primary hypertension Patient is on amlodipine  as an outpatient which was held.  Blood pressure mostly controlled.  Aortic atherosclerosis Hyperlipidemia - Continue Lipitor  OSA Patient currently does not use CPAP as an outpatient.  Dementia Patient formally diagnosed this March by neurology.  Patient on Seroquel as an outpatient.  Patient with associated behavioral disturbances.  At times, patient declines to eat. - Continue Seroquel  Anxiety Depression - Continue Zoloft and Remeron  Underweight Estimated body mass index is 16.46 kg/m as calculated from the following:   Height as of this encounter: 5\' 2"  (1.575 m).   Weight as of this encounter: 40.8 kg.   DVT prophylaxis: Eliquis Code Status:   Code Status: Limited: Do not attempt resuscitation (DNR) -DNR-LIMITED -Do Not Intubate/DNI  Family Communication: Daughter via telephone.  No family at bedside Disposition Plan: Discharge home tomorrow pending improved potassium   Consultants:  Palliative care medicine Orthopedic surgery  Procedures:  Lower extremity venous duplex  Antimicrobials: None   Subjective: Patient initially states she has a headache, but then stated her headache improved. Cries out to God  intermittently  Objective: BP (!) 144/73   Pulse 98   Temp (!) 97.3 F (36.3 C) (Oral)   Resp 16   Ht 5\' 2"  (1.575 m)   Wt 40.8 kg   SpO2 94% Comment: rechecked  BMI 16.46 kg/m   Examination:  General exam: Appears calm and comfortable Respiratory system: Clear to auscultation. Respiratory effort normal. Cardiovascular system: S1 & S2 heard, RRR. Gastrointestinal system: Abdomen is nondistended, soft and nontender. Normal bowel sounds heard. Central nervous system: Alert. Musculoskeletal: LLE pitting edema. No calf tenderness Psychiatry: Judgement and insight appear impaired. Labile affect   Data Reviewed: I have personally reviewed following labs and imaging studies  CBC Lab Results  Component Value Date   WBC 8.8 02/24/2024   RBC 2.81 (L) 02/24/2024   HGB 8.0 (L) 02/24/2024   HCT 26.2 (L) 02/24/2024   MCV 93.2 02/24/2024   MCH 28.5 02/24/2024   PLT 450 (H) 02/24/2024   MCHC 30.5 02/24/2024   RDW 15.8 (H) 02/24/2024   LYMPHSABS 1.4 02/20/2024   MONOABS 0.9 02/20/2024   EOSABS 0.0 02/20/2024   BASOSABS 0.0 02/20/2024     Last metabolic panel Lab Results  Component Value Date   NA 137 02/25/2024   K 5.5 (H) 02/25/2024   CL 112 (H) 02/25/2024   CO2 21 (L) 02/25/2024   BUN 56 (H) 02/25/2024   CREATININE 1.43 (H) 02/25/2024   GLUCOSE 120 (H) 02/25/2024   GFRNONAA 35 (L) 02/25/2024   GFRAA 34 (L) 06/20/2020   CALCIUM  8.7 (L) 02/25/2024   PROT 6.6 02/21/2024   ALBUMIN  2.8 (L) 02/21/2024   BILITOT 0.8 02/21/2024   ALKPHOS 66 02/21/2024   AST 37 02/21/2024   ALT 22 02/21/2024   ANIONGAP 4 (L) 02/25/2024    GFR: Estimated Creatinine Clearance: 16.8 mL/min (A) (by C-G formula based on SCr of 1.43 mg/dL (H)).  No results found for this or any previous visit (from the past 240 hours).    Radiology Studies: No results found.    LOS: 4 days    [name], MD [facility] 02/25/2024, 1:07 PM   If 7PM-7AM, please contact  night-coverage [email or web address]

## 2024-02-25 NOTE — Progress Notes (Signed)
 [facility] Liaison Note  Hospital Discharge planned for today, now cancelled.   Hospital Liaison Team will continue to follow for discharge disposition.  Please call with any hospice related questions or concerns.  Thank you, Lestine Rathke, BSN, [facility] [phone]

## 2024-02-25 NOTE — TOC Transition Note (Addendum)
 Transition of Care [facility]) - Discharge Note   Patient Details  Name: Janet Gilbert MRN: [id number] Date of Birth: [birth year]  [facility] CM/SW Contact:  Ruben Corolla, RN Phone Number: 02/25/2024, 10:56 AM   Clinical Narrative:  d/c home w/Hospice-[facility] rep Shawn aware-dme to arrive in home prior PTAR confirmed address.DNR.    Final next level of care: Home w Hospice Care Barriers to Discharge: No Barriers Identified   Patient Goals and CMS Choice Patient states their goals for this hospitalization and ongoing recovery are:: Home w/hospice CMS Medicare.gov Compare Post Acute Care list provided to:: Patient Represenative (must comment) (Home w/[facility]ice[facility] care) Choice offered to / list presented to : Adult Children [place] ownership interest in [facility]provided to:: Adult Children    Discharge Placement                       Discharge Plan and Services Additional resources added to the After Visit Summary for     Discharge Planning Services: CM Consult Post Acute Care Choice: Hospice                    HH Arranged: RN [facility] Agency: [facility] Date [facility] Agency Contacted: 02/24/24 Time HH Agency Contacted: [phone] Representative spoke with at [facility] Agency: Shawn  Social Drivers of Health (SDOH) Interventions SDOH Screenings   Food Insecurity: No Food Insecurity (02/21/2024)  Housing: Low Risk  (02/21/2024)  Transportation Needs: No Transportation Needs (02/21/2024)  Utilities: Not At Risk (02/21/2024)  Social Connections: Socially Integrated (02/20/2024)  Tobacco Use: Low Risk  (02/20/2024)  Recent Concern: Tobacco Use - Medium Risk (12/08/2023)     Readmission Risk Interventions     No data to display

## 2024-02-25 NOTE — Progress Notes (Signed)
 Daily Progress Note   Patient Name: Janet Gilbert       Date: 02/25/2024 DOB: 06-09-1932  Age: 89 y.o. MRN#: [id number] Attending Physician: Janet Goad, MD Primary Care Physician: Janet Saunas, MD (Inactive) Admit Date: 02/20/2024 Length of Stay: 4 days  Reason for Consultation/Follow-up: Establishing goals of care  Subjective:   Reviewed EMR prior to presenting to bedside.  Discussed care with hospitalist and RN for updates.  Patient had a rough morning with multiple episodes of agitation and crying out.  Patient calm at this time, did not disturb patient.  Daughter not present at bedside.  Due to patient's worsening potassium, going to receive medication management today with plan to discharge home with hospice tomorrow potentially.  Palliative medicine team continuing to follow along with patient's medical journey.  Objective:   Vital Signs:  BP (!) 157/58 (BP Location: Left Arm)   Pulse 89   Temp 97.9 F (36.6 C) (Oral)   Resp 18   Ht 5\' 2"  (1.575 m)   Wt 40.8 kg   SpO2 100%   BMI 16.46 kg/m   Physical Exam: General: NAD, resting in bed, cachectic, frail, chronically ill-appearing Cardiovascular: RRR Respiratory: no increased work of breathing noted, not in respiratory distress  Assessment & Plan:   Assessment: Patient is a 89 year old female with a past medical history of dementia, diabetes mellitus type 2, hypertension, hyperlipidemia, OSA not on CPAP, PAD, and left leg DVT 47 years ago after surgery who was admitted on 02/20/2024 for progressive weakness, confusion, and episodes of diarrhea 2 days prior to admission. Patient had a fall 3 months ago and since that time has had decreased mobility. Upon admission, patient found to have acute LLE DVT and AKI on CKD. Palliative medicine team consulted to assist with complex medical decision making.   Recommendations/Plan: # Complex medical decision making/goals of care:   -Patient resting comfortably at time of visit so  did not disturb.  Daughter not present at bedside.  Patient initially plan for home with hospice today though will receive medical management for hyperkalemia.  Plan is for patient to go home with [facility] hospice tomorrow potentially.                -  Code Status: Limited: Do not attempt resuscitation (DNR) -DNR-LIMITED -Do Not Intubate/DNI                                - Placed signed gold form on patient's paper chart.   # Symptom management: Patient is receiving these palliative interventions for symptom management with an intent to improve quality of life.                 - Pain, in the setting of acute LLE DVT                               - Continue oxycodone  5 mg every 6 hours as needed.  Can increase the lability to every 4 hours if needed.                  - Bowel regimen, in setting of opioid use                               - Continue senna 1 tab twice daily                               -  Continue MiraLAX  17 g on 02/24/2024.   # Psychosocial Support:                - Daughter-Janet Gilbert   # Discharge Planning: Home with Hospice with [facility] hospice  Thank you for allowing the palliative care team to participate in the care Janet Gilbert.  Janet Libel, DO Palliative Care Provider PMT # [phone]  If patient remains symptomatic despite maximum doses, please call PMT at [phone] between 0700 and 1900. Outside of these hours, please call attending, as PMT does not have night coverage.

## 2024-02-26 ENCOUNTER — Other Ambulatory Visit: Payer: Self-pay

## 2024-02-26 ENCOUNTER — Other Ambulatory Visit (HOSPITAL_COMMUNITY): Payer: Self-pay

## 2024-02-26 DIAGNOSIS — I824Y2 Acute embolism and thrombosis of unspecified deep veins of left proximal lower extremity: Secondary | ICD-10-CM | POA: Diagnosis not present

## 2024-02-26 LAB — POTASSIUM: Potassium: 5.1 mmol/L (ref 3.5–5.1)

## 2024-02-26 LAB — GLUCOSE, CAPILLARY
Glucose-Capillary: 107 mg/dL — ABNORMAL HIGH (ref 70–99)
Glucose-Capillary: 160 mg/dL — ABNORMAL HIGH (ref 70–99)

## 2024-02-26 MED ORDER — POLYETHYLENE GLYCOL 3350 17 GM/SCOOP PO POWD: 17.0000 g | Freq: Every day | ORAL | 0 refills | Status: AC

## 2024-02-26 MED ORDER — SODIUM ZIRCONIUM CYCLOSILICATE 10 G PO PACK
10.0000 g | PACK | Freq: Every day | ORAL | 0 refills | Status: AC
  Filled 2024-02-26: qty 3, 3d supply, fill #0

## 2024-02-26 MED ORDER — APIXABAN 5 MG PO TABS
5.0000 mg | ORAL_TABLET | Freq: Two times a day (BID) | ORAL | 0 refills | Status: AC
  Filled 2024-02-26: qty 60, 30d supply, fill #0

## 2024-02-26 MED ORDER — OXYCODONE HCL 5 MG PO TABS
5.0000 mg | ORAL_TABLET | ORAL | 0 refills | Status: AC | PRN
  Filled 2024-02-26: qty 20, 4d supply, fill #0

## 2024-02-26 MED ORDER — SODIUM ZIRCONIUM CYCLOSILICATE 10 G PO PACK
10.0000 g | PACK | Freq: Every day | ORAL | Status: DC
  Administered 2024-02-26: 10 g via ORAL
  Filled 2024-02-26: qty 1

## 2024-02-26 MED ORDER — ENSURE PLUS HIGH PROTEIN PO LIQD: 237.0000 mL | Freq: Two times a day (BID) | ORAL | Status: AC

## 2024-02-26 NOTE — Progress Notes (Signed)
 Discharge teacher was done. I spoke with Patient's Daughter Janet Gilbert.

## 2024-02-26 NOTE — Care Management Important Message (Signed)
 Important Message  Patient Details IM Letter mailed due to Patient Discharging Name: Janet Gilbert MRN: [id number] Date of Birth: [birth year]   Important Message Given:  Yes - Medicare IM     Curtiss Dowdy 02/26/2024, 12:56 PM

## 2024-02-26 NOTE — Discharge Summary (Addendum)
 Physician Discharge Summary   Patient: Janet Gilbert MRN: [id number] DOB: 20-Nov-1931  Admit date:     02/20/2024  Discharge date: 02/26/24  Discharge Physician: [name], MD   PCP: Lysle Saunas, MD (Inactive)   Recommendations at discharge:  Hospice care at home  Discharge Diagnoses: Principal Problem:   Acute deep vein thrombosis (DVT) of left lower extremity [facility]) Active Problems:   Hyperkalemia   Gastroesophageal reflux disease without esophagitis   Aortic atherosclerosis (HCC)   Essential hypertension   Hypercholesterolemia   Obstructive sleep apnea   Stage 3b chronic kidney disease (HCC)   Acute deep vein thrombosis (DVT) (HCC)   Palliative care encounter   Pain   Counseling and coordination of care   AKI (acute kidney injury) (HCC)   Dementia (HCC)   Medication management   DNR (do not resuscitate)   Failure to thrive in adult   Goals of care, counseling/discussion  Resolved Problems:   * No resolved hospital problems. [facility] Course: Janet Gilbert is a 89 y.o. female with a history of osteoarthritis, asthma, diabetes mellitus type 2, hiatal hernia, hypertension, peripheral vascular disease, sleep apnea, dementia.  Patient presented secondary to left lower extremity edema was found to have evidence of significant left lower extremity DVT.  Patient started on heparin IV for management.  Hospitalization complicated by associated AKI which has improved with IV fluids.  Palliative care was consulted and patient has transitioned to home with hospice care.  Assessment and Plan: Acute left lower extremity DVT Chronic right lower extremity DVT Provoked secondary to prolonged and progressive immobility.  Patient presented with left greater than right leg swelling was found to have evidence of an acute lower extremity DVT involving the external iliac vein, left common femoral vein, SF junction, left femoral vein, left proximal profunda vein, left popliteal vein, left  posterior tibial veins, and left peroneal vein in addition to an acute intramuscular thrombus involving the left gastrocnemius vein.  Patient was started on heparin drip for management with transition to Eliquis. Continue Eliquis 5 mg twice daily on discharge.   AKI on CKD stage IV No recent baseline available.  Last creatinine of 1.59 from 2021.  Creatinine of 3.12 on admission likely related to poor oral intake and dehydration.  Patient managed with IV fluids with resultant improvement in creatinine; creatinine slightly increased after discontinuation of IV fluids. Improved.   Hyperkalemia Appears to be iatrogenic as patient is on potassium supplementation.  Complicated by AKI in addition to severe CKD. Potassium supplementation discontinued. Peak potassium of 5.6, with improvement after Lokelma dosing Potassium improved to 5.1 on day of discharge. Will discharge with Lokelma x3 days.   Acute metabolic acidosis Likely secondary to AKI.  Mild.  Improved.   Left knee swelling Like related to history of fall.  Left knee x-ray was obtained and was significant for moderate joint effusion.  Currently asymptomatic.  Orthopedic surgery was consulted and recommended against inpatient aspiration or injection secondary to high risk for hemarthrosis in setting of anticoagulation.   Acute on chronic anemia No recent baseline hemoglobin available.  Last hemoglobin of 11.7 from 2021.  Hemoglobin of 8.6 on admission with mild decrease likely related to IV fluids.  Currently stable.   Hyponatremia Resolved.   GERD Continue Protonix   Primary hypertension Patient is on amlodipine  as an outpatient which was held.  Blood pressure mostly controlled.   Aortic atherosclerosis Hyperlipidemia Continue Lipitor   OSA Patient currently does not use CPAP  as an outpatient.   Dementia Patient formally diagnosed this March by neurology.  Patient on Seroquel as an outpatient.  Patient with associated behavioral  disturbances.  At times, patient declines to eat. Continue Seroquel.   Anxiety Depression Continue Ativan, Zoloft and Remeron   Underweight Estimated body mass index is 16.46 kg/m as calculated from the following:   Height as of this encounter: 5\' 2"  (1.575 m).   Weight as of this encounter: 40.8 kg.   Consultants:  Palliative care medicine Orthopedic surgery   Procedures:  Lower extremity venous duplex  Disposition: Home/Hospice care Diet recommendation: Regular diet   DISCHARGE MEDICATION: Allergies as of 02/26/2024       Reactions   Contrast Media [iodinated Contrast Media] Hives, Shortness Of Breath   Hypaque 60%   Sulfa Antibiotics Hives, Shortness Of Breath   Azithromycin Rash   Codeine Nausea Only   Darvon [propoxyphene] Nausea Only        Medication List     STOP taking these medications    diphenhydramine -acetaminophen  25-500 MG Tabs tablet Commonly known as: TYLENOL  PM   metFORMIN  1000 MG tablet Commonly known as: GLUCOPHAGE        TAKE these medications    acetaminophen  650 MG CR tablet Commonly known as: TYLENOL  Take 1,300 mg by mouth daily as needed for pain.   amLODipine  5 MG tablet Commonly known as: NORVASC  Take 5 mg by mouth daily.   atorvastatin  20 MG tablet Commonly known as: LIPITOR Take 20 mg by mouth daily.   Eliquis 5 MG Tabs tablet Generic drug: apixaban Take 1 tablet (5 mg total) by mouth 2 (two) times daily.   feeding supplement Liqd Take 237 mLs by mouth 2 (two) times daily between meals.   Lokelma 10 g Pack packet Generic drug: sodium zirconium cyclosilicate Take 10 g by mouth daily for 3 days for 3 days.   LORazepam 0.5 MG tablet Commonly known as: ATIVAN Take 0.5 mg by mouth at bedtime as needed for anxiety.   mirtazapine 7.5 MG tablet Commonly known as: REMERON Take 7.5 mg by mouth at bedtime.   oxyCODONE  5 MG immediate release tablet Commonly known as: Oxy IR/ROXICODONE  Take 1 tablet (5 mg total) by  mouth every 4 (four) hours as needed for moderate pain (pain score 4-6).   pantoprazole 40 MG tablet Commonly known as: PROTONIX Take 40 mg by mouth daily.   polyethylene glycol powder 17 GM/SCOOP powder Commonly known as: MiraLax  Take 17 g by mouth daily.   QUEtiapine 50 MG tablet Commonly known as: SEROQUEL Take 50 mg by mouth at bedtime.   sertraline 100 MG tablet Commonly known as: ZOLOFT Take 100 mg by mouth daily.   trolamine salicylate 10 % cream Commonly known as: ASPERCREME Apply 1 Application topically as needed for muscle pain.        Follow-up Information     [facility] up.   Specialty: PALLIATIVE CARE Why: Home hospice;dme Contact information: [address] [place]  [address] [phone]               Discharge Exam: BP (!) 159/66 (BP Location: Left Arm)   Pulse (!) 101   Temp 98.4 F (36.9 C) (Oral)   Resp 16   Ht 5\' 2"  (1.575 m)   Wt 40.8 kg   SpO2 100%   BMI 16.46 kg/m   General exam: Appears calm and comfortable Respiratory system: Respiratory effort normal. Central nervous system: Alert.  Musculoskeletal: LLE pitting edema.  No calf tenderness  Condition at discharge: stable  The results of significant diagnostics from this hospitalization (including imaging, microbiology, ancillary and laboratory) are listed below for reference.   Imaging Studies: ECHOCARDIOGRAM COMPLETE Result Date: 02/21/2024    ECHOCARDIOGRAM REPORT   Patient Name:   JACQUELINE SPOFFORD Date of Exam: 02/21/2024 Medical Rec #:  [id number]      Height:       62.0 in Accession #:    [id number]     Weight:       90.0 lb Date of Birth:  Feb 06, 1932      BSA:          1.362 m Patient Age:    89 years       BP:           129/57 mmHg Patient Gender: F              HR:           91 bpm. Exam Location:  Inpatient Procedure: 2D Echo, Cardiac Doppler and Color Doppler (Both Spectral and Color            Flow Doppler were utilized during procedure).  Indications:    R06.02 SOB  History:        Patient has no prior history of Echocardiogram examinations.                 Signs/Symptoms:Altered Mental Status and Edema; Risk                 Factors:Hypertension, Dyslipidemia and Sleep Apnea. Cancer.  Sonographer:    Raynelle Callow RDCS Referring Phys: [id number] DAVID MANUEL ORTIZ  Sonographer Comments: Technically difficult study due to poor echo windows. Image acquisition challenging due to respiratory motion. Patient windows extremely respiratory dependent. Could only obtain apical images on inspiration. Patient had difficulty staying awake to follow breathing instructions. IMPRESSIONS  1. Left ventricular ejection fraction, by estimation, is 70 to 75%. The left ventricle has hyperdynamic function. Left ventricular endocardial border not optimally defined to evaluate regional wall motion. Left ventricular diastolic parameters are consistent with Grade I diastolic dysfunction (impaired relaxation).  2. Right ventricular systolic function is normal. The right ventricular size is normal. Tricuspid regurgitation signal is inadequate for assessing PA pressure.  3. The mitral valve is degenerative. Trivial mitral valve regurgitation. No evidence of mitral stenosis.  4. The aortic valve was not well visualized. Aortic valve regurgitation is mild. Aortic valve sclerosis is present, with no evidence of aortic valve stenosis.  5. The inferior vena cava is normal in size with greater than 50% respiratory variability, suggesting right atrial pressure of 3 mmHg. Comparison(s): No prior Echocardiogram. FINDINGS  Left Ventricle: Left ventricular ejection fraction, by estimation, is 70 to 75%. The left ventricle has hyperdynamic function. Left ventricular endocardial border not optimally defined to evaluate regional wall motion. The left ventricular internal cavity size was normal in size. There is no left ventricular hypertrophy. Left ventricular diastolic parameters are consistent  with Grade I diastolic dysfunction (impaired relaxation). Normal left ventricular filling pressure. Right Ventricle: The right ventricular size is normal. No increase in right ventricular wall thickness. Right ventricular systolic function is normal. Tricuspid regurgitation signal is inadequate for assessing PA pressure. Left Atrium: Left atrial size was not well visualized. Right Atrium: Right atrial size was not well visualized. Pericardium: There is no evidence of pericardial effusion. Mitral Valve: The mitral valve is degenerative in appearance. Trivial mitral valve regurgitation. No evidence of mitral  valve stenosis. Tricuspid Valve: The tricuspid valve is not well visualized. Tricuspid valve regurgitation is trivial. No evidence of tricuspid stenosis. Aortic Valve: The aortic valve was not well visualized. Aortic valve regurgitation is mild. Aortic regurgitation PHT measures 354 msec. Aortic valve sclerosis is present, with no evidence of aortic valve stenosis. Aortic valve mean gradient measures 4.4 mmHg. Aortic valve peak gradient measures 8.9 mmHg. Aortic valve area, by VTI measures 2.03 cm. Pulmonic Valve: The pulmonic valve was not well visualized. Pulmonic valve regurgitation is not visualized. No evidence of pulmonic stenosis. Aorta: The aortic root is normal in size and structure and the ascending aorta was not well visualized. Venous: The inferior vena cava is normal in size with greater than 50% respiratory variability, suggesting right atrial pressure of 3 mmHg. IAS/Shunts: No atrial level shunt detected by color flow Doppler.  LEFT VENTRICLE PLAX 2D LVIDd:         3.10 cm     Diastology LVIDs:         1.90 cm     LV e' medial:    8.59 cm/s LV PW:         0.80 cm     LV E/e' medial:  6.8 LV IVS:        1.10 cm     LV e' lateral:   11.50 cm/s LVOT diam:     2.20 cm     LV E/e' lateral: 5.1 LV SV:         52 LV SV Index:   38 LVOT Area:     3.80 cm  LV Volumes (MOD) LV vol d, MOD A2C: 37.3 ml LV vol  d, MOD A4C: 53.1 ml LV vol s, MOD A2C: 10.2 ml LV vol s, MOD A4C: 15.7 ml LV SV MOD A2C:     27.1 ml LV SV MOD A4C:     53.1 ml LV SV MOD BP:      32.2 ml RIGHT VENTRICLE             IVC RV S prime:     14.00 cm/s  IVC diam: 1.40 cm TAPSE (M-mode): 1.0 cm LEFT ATRIUM             Index        RIGHT ATRIUM          Index LA diam:        1.30 cm 0.95 cm/m   RA Area:     7.90 cm LA Vol (A2C):   18.0 ml 13.22 ml/m  RA Volume:   15.20 ml 11.16 ml/m LA Vol (A4C):   15.7 ml 11.53 ml/m LA Biplane Vol: 17.5 ml 12.85 ml/m  AORTIC VALVE AV Area (Vmax):    2.35 cm AV Area (Vmean):   2.29 cm AV Area (VTI):     2.03 cm AV Vmax:           148.77 cm/s AV Vmean:          97.043 cm/s AV VTI:            0.256 m AV Peak Grad:      8.9 mmHg AV Mean Grad:      4.4 mmHg LVOT Vmax:         91.90 cm/s LVOT Vmean:        58.400 cm/s LVOT VTI:          0.137 m LVOT/AV VTI ratio: 0.53 AI PHT:  354 msec  AORTA Ao Root diam: 3.00 cm MITRAL VALVE MV Area (PHT): 3.92 cm    SHUNTS MV Decel Time: 194 msec    Systemic VTI:  0.14 m MV E velocity: 58.45 cm/s  Systemic Diam: 2.20 cm MV A velocity: 76.50 cm/s MV E/A ratio:  0.76 Armida Lander MD Electronically signed by Armida Lander MD Signature Date/Time: 02/21/2024/1:12:03 PM    Final    VAS US  LOWER EXTREMITY VENOUS (DVT) Result Date: 02/20/2024  Lower Venous DVT Study Patient Name:  SHANY MARINEZ  Date of Exam:   02/20/2024 Medical Rec #: [id number]       Accession #:    [id number] Date of Birth: 12-07-1931       Patient Gender: F Patient Age:   83 years Exam Location:  [facility] Procedure:      VAS US  LOWER EXTREMITY VENOUS (DVT) Referring Phys: DAN FLOYD --------------------------------------------------------------------------------  Indications: Pain.  Risk Factors: None identified. Limitations: Poor ultrasound/tissue interface and patient positioning, patient immobility. Comparison Study: No prior studies. Performing Technologist: Lerry Ransom RVT   Examination Guidelines: A complete evaluation includes B-mode imaging, spectral Doppler, color Doppler, and power Doppler as needed of all accessible portions of each vessel. Bilateral testing is considered an integral part of a complete examination. Limited examinations for reoccurring indications may be performed as noted. The reflux portion of the exam is performed with the patient in reverse Trendelenburg.  +-----+---------------+---------+-----------+----------+--------------+ RIGHTCompressibilityPhasicitySpontaneityPropertiesThrombus Aging +-----+---------------+---------+-----------+----------+--------------+ CFV  Partial        Yes      Yes                  Chronic        +-----+---------------+---------+-----------+----------+--------------+   +---------+---------------+---------+-----------+----------+--------------+ LEFT     CompressibilityPhasicitySpontaneityPropertiesThrombus Aging +---------+---------------+---------+-----------+----------+--------------+ CFV      None           No       No                   Acute          +---------+---------------+---------+-----------+----------+--------------+ SFJ      None           No       No                   Acute          +---------+---------------+---------+-----------+----------+--------------+ FV Prox  None           No       No                   Acute          +---------+---------------+---------+-----------+----------+--------------+ FV Mid   None           No       No                   Acute          +---------+---------------+---------+-----------+----------+--------------+ FV DistalNone           No       No                   Acute          +---------+---------------+---------+-----------+----------+--------------+ PFV      None           No       No  Acute          +---------+---------------+---------+-----------+----------+--------------+ POP      None           No        No                   Acute          +---------+---------------+---------+-----------+----------+--------------+ PTV      None                                         Acute          +---------+---------------+---------+-----------+----------+--------------+ PERO     None                                         Acute          +---------+---------------+---------+-----------+----------+--------------+ Gastroc  None                                         Acute          +---------+---------------+---------+-----------+----------+--------------+ EIV      None           No       No                   Acute          +---------+---------------+---------+-----------+----------+--------------+ CIV                     Yes      Yes                                 +---------+---------------+---------+-----------+----------+--------------+     Summary: RIGHT: - Findings consistent with chronic deep vein thrombosis involving the right common femoral vein.  LEFT: - Findings consistent with acute deep vein thrombosis involving the left external iliac vein, left common femoral vein, SF junction, left femoral vein, left proximal profunda vein, left popliteal vein, left posterior tibial veins, and left peroneal veins. Findings consistent with acute intramuscular thrombosis involving the left gastrocnemius veins.  *See table(s) above for measurements and observations. Electronically signed by Jimmye Moulds MD on 02/20/2024 at 3:33:27 PM.    Final    DG Knee Complete 4 Views Left Result Date: 02/20/2024 CLINICAL DATA:  fatigue EXAM: LEFT KNEE - COMPLETE 4+ VIEW COMPARISON:  None Available. FINDINGS: Osteopenia. No acute fracture or dislocation. Joint spaces and alignment are maintained. No area of erosion or osseous destruction. No unexpected radiopaque foreign body. Joint effusion, moderate. IMPRESSION: Moderate joint effusion. No acute fracture or dislocation. Electronically Signed   By:  Clancy Crimes M.D.   On: 02/20/2024 12:11   DG Chest Port 1 View Result Date: 02/20/2024 CLINICAL DATA:  fatigue EXAM: PORTABLE CHEST 1 VIEW COMPARISON:  June 20, 2020 FINDINGS: The cardiomediastinal silhouette is unchanged in contour given differences in technique.Atherosclerotic calcifications. No pleural effusion. No pneumothorax. No acute pleuroparenchymal abnormality. Status post RIGHT shoulder arthroplasty. Surgical clips project over the RIGHT neck. IMPRESSION: No acute cardiopulmonary abnormality. Electronically Signed   By: Clancy Crimes M.D.   On: 02/20/2024 12:10  Microbiology: Results for orders placed or performed during the hospital encounter of 06/21/20  Respiratory Panel by RT PCR (Flu A&B, Covid) - Nasopharyngeal Swab     Status: None   Collection Time: 06/21/20  2:27 AM   Specimen: Nasopharyngeal Swab  Result Value Ref Range Status   SARS Coronavirus 2 by RT PCR NEGATIVE NEGATIVE Final    Comment: (NOTE) SARS-CoV-2 target nucleic acids are NOT DETECTED.  The SARS-CoV-2 RNA is generally detectable in upper respiratoy specimens during the acute phase of infection. The lowest concentration of SARS-CoV-2 viral copies this assay can detect is 131 copies/mL. A negative result does not preclude SARS-Cov-2 infection and should not be used as the sole basis for treatment or other patient management decisions. A negative result may occur with  improper specimen collection/handling, submission of specimen other than nasopharyngeal swab, presence of viral mutation(s) within the areas targeted by this assay, and inadequate number of viral copies (<131 copies/mL). A negative result must be combined with clinical observations, patient history, and epidemiological information. The expected result is Negative.  Fact Sheet for Patients:  https://www.moore.com/  Fact Sheet for Healthcare Providers:  https://www.young.biz/  This  test is no t yet approved or cleared by the [place]  FDA and  has been authorized for detection and/or diagnosis of SARS-CoV-2 by FDA under an Emergency Use Authorization (EUA). This EUA will remain  in effect (meaning this test can be used) for the duration of the COVID-19 declaration under Section 564(b)(1) of the Act, 21 U.S.C. section 360bbb-3(b)(1), unless the authorization is terminated or revoked sooner.     Influenza A by PCR NEGATIVE NEGATIVE Final   Influenza B by PCR NEGATIVE NEGATIVE Final    Comment: (NOTE) The Xpert Xpress SARS-CoV-2/FLU/RSV assay is intended as an aid in  the diagnosis of influenza from Nasopharyngeal swab specimens and  should not be used as a sole basis for treatment. Nasal washings and  aspirates are unacceptable for Xpert Xpress SARS-CoV-2/FLU/RSV  testing.  Fact Sheet for Patients: https://www.moore.com/  Fact Sheet for Healthcare Providers: https://www.young.biz/  This test is not yet approved or cleared by the [place]  FDA and  has been authorized for detection and/or diagnosis of SARS-CoV-2 by  FDA under an Emergency Use Authorization (EUA). This EUA will remain  in effect (meaning this test can be used) for the duration of the  Covid-19 declaration under Section 564(b)(1) of the Act, 21  U.S.C. section 360bbb-3(b)(1), unless the authorization is  terminated or revoked. Performed at [facility], [address], [place], [place] [address]     Labs: CBC: Recent Labs  Lab 02/20/24 1258 02/21/24 0118 02/22/24 0420 02/23/24 0441 02/24/24 0453  WBC 9.0 8.8 9.3 9.1 8.8  NEUTROABS 6.7  --   --   --   --   HGB 8.6* 9.0* 8.2* 7.6* 8.0*  HCT 27.6* 29.4* 25.9* 25.1* 26.2*  MCV 91.4 93.3 89.9 93.0 93.2  PLT 269 331 326 376 450*   Basic Metabolic Panel: Recent Labs  Lab 02/20/24 1126 02/21/24 0118 02/22/24 0417 02/23/24 0441 02/24/24 0453 02/24/24 1459  02/25/24 0457 02/25/24 1045 02/25/24 1356 02/26/24 0421  NA 130*   < > 129* 134* 134* 137 137  --   --   --   K 5.3*   < > 4.5 4.7 5.3* 5.4* 5.4* 5.5* 5.6* 5.1  CL 99   < > 109 112* 110 112* 112*  --   --   --   CO2  15*   < > 17* 18* 20* 20* 21*  --   --   --   GLUCOSE 168*   < > 145* 132* 128* 181* 120*  --   --   --   BUN 65*   < > 56* 50* 58* 59* 56*  --   --   --   CREATININE 3.12*   < > 1.57* 1.15* 1.47* 1.55* 1.43*  --   --   --   CALCIUM  9.0   < > 7.6* 8.2* 8.5* 8.8* 8.7*  --   --   --   MG 2.4  --   --   --   --   --   --   --   --   --    < > = values in this interval not displayed.   Liver Function Tests: Recent Labs  Lab 02/20/24 1126 02/21/24 0118  AST 34 37  ALT 18 22  ALKPHOS 74 66  BILITOT 0.9 0.8  PROT 8.1 6.6  ALBUMIN  3.5 2.8*   CBG: Recent Labs  Lab 02/25/24 1124 02/25/24 1604 02/25/24 2329 02/26/24 0732 02/26/24 1115  GLUCAP 205* 204* 151* 107* 160*    Discharge time spent: 35 minutes.  Signed: [name], MD [facility] 02/26/2024

## 2024-04-06 ENCOUNTER — Other Ambulatory Visit (HOSPITAL_COMMUNITY): Payer: Self-pay

## 2024-09-22 DEATH — deceased
